# Patient Record
Sex: Male | Born: 1935 | Race: White | Hispanic: No | Marital: Married | Smoking: Former smoker
Health system: Southern US, Community
[De-identification: ages and names within clinical notes are randomized; demographics above are authoritative.]

## PROBLEM LIST (undated history)

## (undated) DIAGNOSIS — M1990 Unspecified osteoarthritis, unspecified site: Secondary | ICD-10-CM

## (undated) DIAGNOSIS — I10 Essential (primary) hypertension: Secondary | ICD-10-CM

## (undated) DIAGNOSIS — R21 Rash and other nonspecific skin eruption: Secondary | ICD-10-CM

## (undated) DIAGNOSIS — C61 Malignant neoplasm of prostate: Secondary | ICD-10-CM

## (undated) DIAGNOSIS — C801 Malignant (primary) neoplasm, unspecified: Secondary | ICD-10-CM

## (undated) DIAGNOSIS — K4090 Unilateral inguinal hernia, without obstruction or gangrene, not specified as recurrent: Secondary | ICD-10-CM

## (undated) DIAGNOSIS — K219 Gastro-esophageal reflux disease without esophagitis: Secondary | ICD-10-CM

## (undated) HISTORY — DX: Unspecified osteoarthritis, unspecified site: M19.90

## (undated) HISTORY — DX: Malignant (primary) neoplasm, unspecified: C80.1

## (undated) HISTORY — DX: Unilateral inguinal hernia, without obstruction or gangrene, not specified as recurrent: K40.90

## (undated) HISTORY — PX: MELANOMA EXCISION: SHX5266

## (undated) HISTORY — DX: Essential (primary) hypertension: I10

## (undated) HISTORY — DX: Malignant neoplasm of prostate: C61

## (undated) HISTORY — DX: Gastro-esophageal reflux disease without esophagitis: K21.9

## (undated) HISTORY — PX: OTHER SURGICAL HISTORY: SHX169

---

## 1997-11-20 ENCOUNTER — Other Ambulatory Visit: Admission: RE | Admit: 1997-11-20 | Discharge: 1997-11-20 | Payer: Self-pay | Admitting: Urology

## 2000-03-31 ENCOUNTER — Emergency Department (HOSPITAL_COMMUNITY): Admission: EM | Admit: 2000-03-31 | Discharge: 2000-03-31 | Payer: Self-pay | Admitting: Emergency Medicine

## 2002-04-11 ENCOUNTER — Ambulatory Visit (HOSPITAL_COMMUNITY): Admission: RE | Admit: 2002-04-11 | Discharge: 2002-04-11 | Payer: Self-pay | Admitting: General Surgery

## 2002-04-11 ENCOUNTER — Encounter: Payer: Self-pay | Admitting: General Surgery

## 2002-04-29 ENCOUNTER — Encounter: Payer: Self-pay | Admitting: General Surgery

## 2002-04-29 ENCOUNTER — Encounter: Admission: RE | Admit: 2002-04-29 | Discharge: 2002-04-29 | Payer: Self-pay | Admitting: General Surgery

## 2002-04-30 ENCOUNTER — Ambulatory Visit (HOSPITAL_BASED_OUTPATIENT_CLINIC_OR_DEPARTMENT_OTHER): Admission: RE | Admit: 2002-04-30 | Discharge: 2002-04-30 | Payer: Self-pay | Admitting: General Surgery

## 2002-05-03 ENCOUNTER — Ambulatory Visit (HOSPITAL_COMMUNITY): Admission: RE | Admit: 2002-05-03 | Discharge: 2002-05-03 | Payer: Self-pay | Admitting: Plastic Surgery

## 2002-08-26 ENCOUNTER — Ambulatory Visit: Admission: RE | Admit: 2002-08-26 | Discharge: 2002-10-22 | Payer: Self-pay | Admitting: Radiation Oncology

## 2005-06-07 ENCOUNTER — Encounter: Admission: RE | Admit: 2005-06-07 | Discharge: 2005-06-07 | Payer: Self-pay | Admitting: Internal Medicine

## 2005-06-09 ENCOUNTER — Encounter: Admission: RE | Admit: 2005-06-09 | Discharge: 2005-06-09 | Payer: Self-pay | Admitting: Internal Medicine

## 2005-06-13 HISTORY — PX: BLADDER SURGERY: SHX569

## 2005-06-16 ENCOUNTER — Other Ambulatory Visit: Admission: RE | Admit: 2005-06-16 | Discharge: 2005-06-16 | Payer: Self-pay | Admitting: Otolaryngology

## 2005-06-23 ENCOUNTER — Ambulatory Visit (HOSPITAL_COMMUNITY): Admission: RE | Admit: 2005-06-23 | Discharge: 2005-06-23 | Payer: Self-pay | Admitting: Otolaryngology

## 2005-06-24 ENCOUNTER — Encounter: Admission: RE | Admit: 2005-06-24 | Discharge: 2005-06-24 | Payer: Self-pay | Admitting: Otolaryngology

## 2005-07-08 ENCOUNTER — Encounter: Admission: RE | Admit: 2005-07-08 | Discharge: 2005-07-08 | Payer: Self-pay | Admitting: Internal Medicine

## 2005-10-07 ENCOUNTER — Encounter: Admission: RE | Admit: 2005-10-07 | Discharge: 2005-10-07 | Payer: Self-pay | Admitting: Urology

## 2005-10-10 ENCOUNTER — Ambulatory Visit (HOSPITAL_BASED_OUTPATIENT_CLINIC_OR_DEPARTMENT_OTHER): Admission: RE | Admit: 2005-10-10 | Discharge: 2005-10-10 | Payer: Self-pay | Admitting: Urology

## 2007-09-21 IMAGING — CT NM PET TUM IMG WHOLE BODY
4 series · 25 of 25 positions shown · IV contrast ([ID])
Comparison: None.

CLINICAL DATA: Recurrent left facial melanoma.  Recent surgical resection.  Staging.
 FDG PET-CT TUMOR IMAGING (WHOLE BODY):
 Fasting Blood Glucose:  122
TECHNIQUE: 17.6   mCi F-18 FDG was injected intravenously via the left arm.  Full-ring PET imaging was performed from the skull (base/vertex) through the (entire lower extremities/thighs) 49 minutes after injection.  CT data was obtained and used for attenuation correction and anatomic localization only.  (This was not acquired as a diagnostic CT examination).

[Series 1: pet ac · axial · 3.3mm · 4.69mm/px · z∈[-875,-5]mm · 8 of 267 slices shown]
[im 1/267]
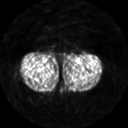
[im 39/267]
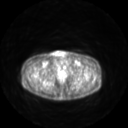
[im 77/267]
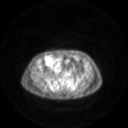
[im 115/267]
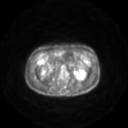
[im 153/267]
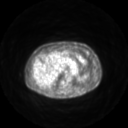
[im 191/267]
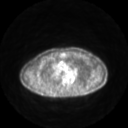
[im 229/267]
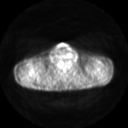
[im 267/267]
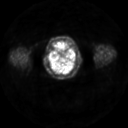

[Series 2: pet nac · axial · 3.3mm · 4.69mm/px · z∈[-875,-5]mm · 8 of 267 slices shown]
[im 1/267]
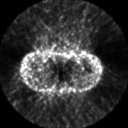
[im 39/267]
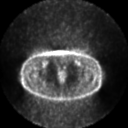
[im 77/267]
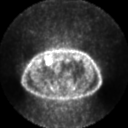
[im 115/267]
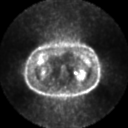
[im 153/267]
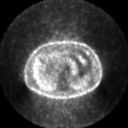
[im 191/267]
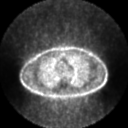
[im 229/267]
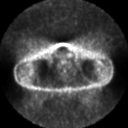
[im 267/267]
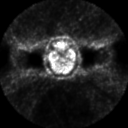

[Series 2: ct images · axial · 3.8mm · 0.98mm/px · z∈[-875,-5]mm · 8 of 267 slices shown]
[im 1/267  soft-tissue]
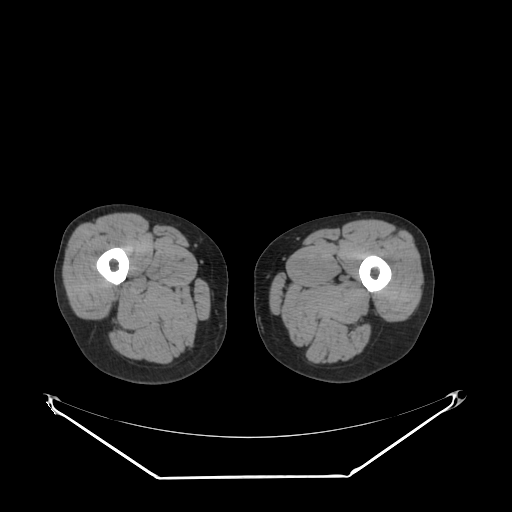
[im 39/267  soft-tissue]
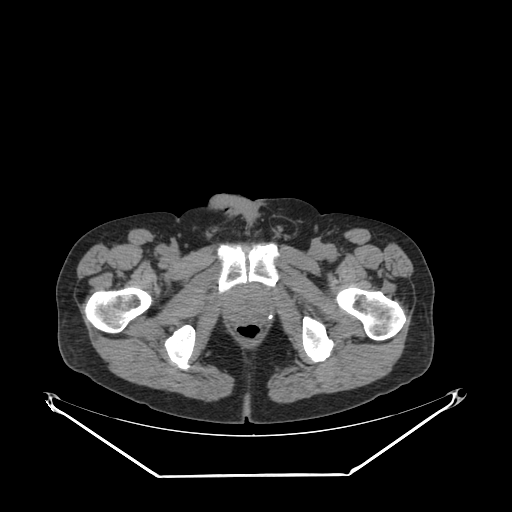
[im 77/267  soft-tissue]
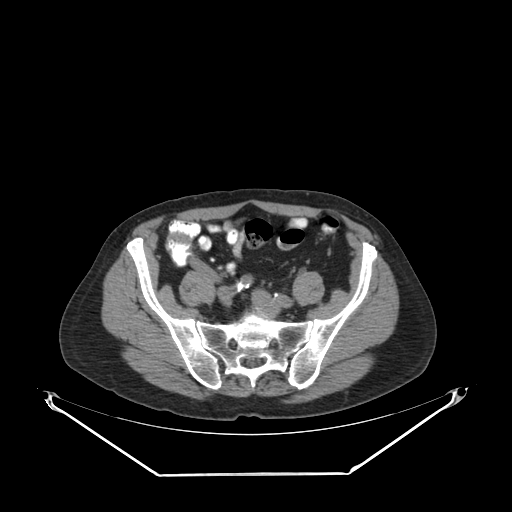
[im 115/267  soft-tissue]
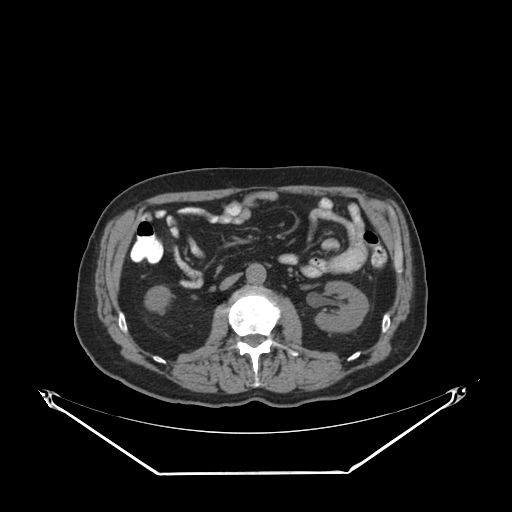
[im 153/267  soft-tissue]
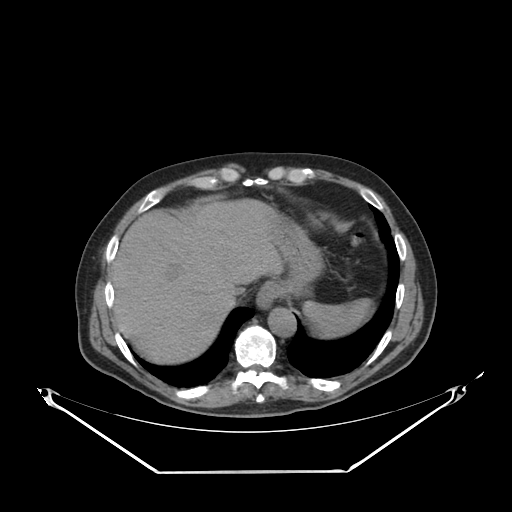
[im 191/267  soft-tissue]
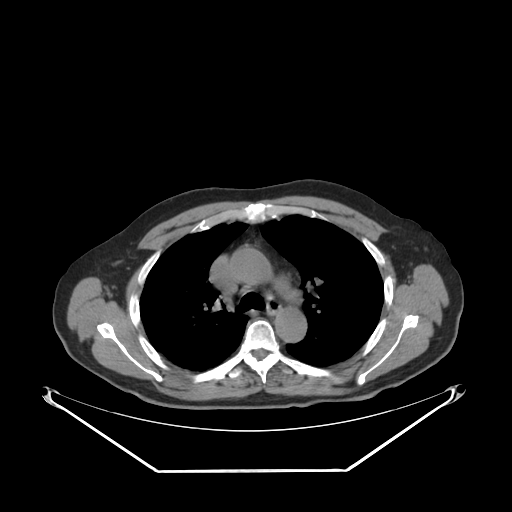
[im 229/267  soft-tissue]
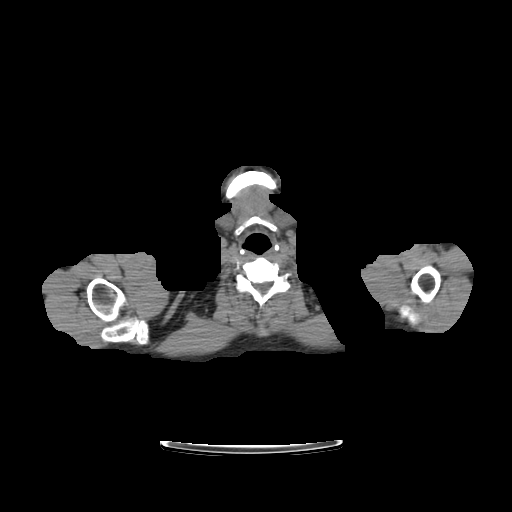
[im 267/267  soft-tissue]
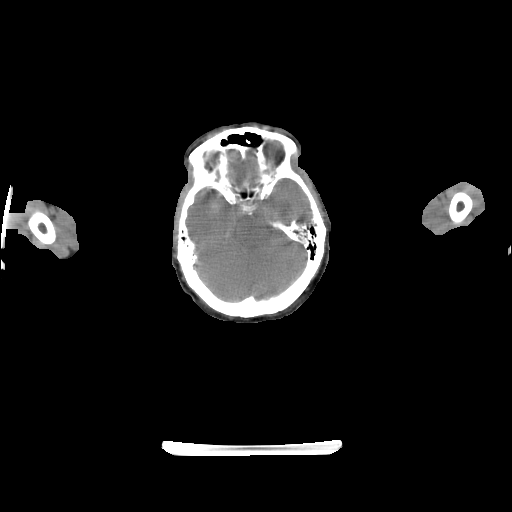

[Series 123: mip · coronal · 3.3mm · 4.69mm/px · 1 of 30 slices shown]
[im 1/30]
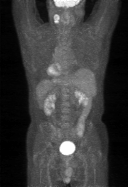

[25 of 25 positions shown; findings below may reference images not displayed]

FINDINGS: Marked FDG uptake is seen within lymphadenopathy which is centered in the left jugulodigastric region of the suprahyoid neck.  This has a maximum SUV of 12.3.  No other sites of abnormal FDG uptake are identified within the neck.
 No abnormal sites of FDG uptake are identified elsewhere within the body from the skull vertex through the lower extremities.
IMPRESSION: 1.  Marked FDG uptake within lymphadenopathy within the left suprahyoid neck, consistent with metastatic disease.
 2.  No other sites of malignancy identified.

## 2010-06-13 HISTORY — PX: RECTAL SURGERY: SHX760

## 2010-07-04 ENCOUNTER — Encounter: Payer: Self-pay | Admitting: Otolaryngology

## 2011-11-09 ENCOUNTER — Encounter (INDEPENDENT_AMBULATORY_CARE_PROVIDER_SITE_OTHER): Payer: 59 | Admitting: Ophthalmology

## 2011-11-09 DIAGNOSIS — H35039 Hypertensive retinopathy, unspecified eye: Secondary | ICD-10-CM

## 2011-11-09 DIAGNOSIS — H4310 Vitreous hemorrhage, unspecified eye: Secondary | ICD-10-CM

## 2011-11-09 DIAGNOSIS — H33309 Unspecified retinal break, unspecified eye: Secondary | ICD-10-CM

## 2011-11-09 DIAGNOSIS — H2510 Age-related nuclear cataract, unspecified eye: Secondary | ICD-10-CM

## 2011-11-09 DIAGNOSIS — I10 Essential (primary) hypertension: Secondary | ICD-10-CM

## 2011-11-09 DIAGNOSIS — H43819 Vitreous degeneration, unspecified eye: Secondary | ICD-10-CM

## 2011-11-16 ENCOUNTER — Ambulatory Visit (INDEPENDENT_AMBULATORY_CARE_PROVIDER_SITE_OTHER): Payer: 59 | Admitting: Ophthalmology

## 2011-11-17 ENCOUNTER — Ambulatory Visit (INDEPENDENT_AMBULATORY_CARE_PROVIDER_SITE_OTHER): Payer: 59 | Admitting: Ophthalmology

## 2011-11-17 DIAGNOSIS — H4310 Vitreous hemorrhage, unspecified eye: Secondary | ICD-10-CM

## 2011-11-17 DIAGNOSIS — H33309 Unspecified retinal break, unspecified eye: Secondary | ICD-10-CM

## 2011-12-28 ENCOUNTER — Encounter (INDEPENDENT_AMBULATORY_CARE_PROVIDER_SITE_OTHER): Payer: 59 | Admitting: Ophthalmology

## 2011-12-28 DIAGNOSIS — H33309 Unspecified retinal break, unspecified eye: Secondary | ICD-10-CM

## 2012-04-04 ENCOUNTER — Encounter (INDEPENDENT_AMBULATORY_CARE_PROVIDER_SITE_OTHER): Payer: 59 | Admitting: Ophthalmology

## 2012-04-04 DIAGNOSIS — H2510 Age-related nuclear cataract, unspecified eye: Secondary | ICD-10-CM

## 2012-04-04 DIAGNOSIS — H3530 Unspecified macular degeneration: Secondary | ICD-10-CM

## 2012-04-04 DIAGNOSIS — H35039 Hypertensive retinopathy, unspecified eye: Secondary | ICD-10-CM

## 2012-04-04 DIAGNOSIS — H33309 Unspecified retinal break, unspecified eye: Secondary | ICD-10-CM

## 2012-04-04 DIAGNOSIS — H43819 Vitreous degeneration, unspecified eye: Secondary | ICD-10-CM

## 2012-04-04 DIAGNOSIS — I10 Essential (primary) hypertension: Secondary | ICD-10-CM

## 2012-10-23 ENCOUNTER — Encounter (INDEPENDENT_AMBULATORY_CARE_PROVIDER_SITE_OTHER): Payer: Self-pay | Admitting: General Surgery

## 2012-10-23 ENCOUNTER — Ambulatory Visit (INDEPENDENT_AMBULATORY_CARE_PROVIDER_SITE_OTHER): Payer: 59 | Admitting: General Surgery

## 2012-10-23 VITALS — BP 120/78 | HR 74 | Temp 97.2°F | Ht 70.0 in | Wt 172.0 lb

## 2012-10-23 DIAGNOSIS — K429 Umbilical hernia without obstruction or gangrene: Secondary | ICD-10-CM

## 2012-10-23 DIAGNOSIS — K4090 Unilateral inguinal hernia, without obstruction or gangrene, not specified as recurrent: Secondary | ICD-10-CM

## 2012-10-23 NOTE — Patient Instructions (Signed)
You have a moderately large left inguinal hernia which has been causing pain. There is some risk to this and it should be repaired electively at her convenience.  You also have a small umbilical hernia, but there is no urgent need to repair that.  We have discussed different techniques, and he has elected to have the left anal hernia repaired laparoscopically.  He will be scheduled for laparoscopic repair of a left inguinal hernia with mesh, possible open. We might be able to repair the umbilical hernia all the way out since one of the incisions will be just below the umbilicus.   Inguinal Hernia, Adult Muscles help keep everything in the body in its proper place. But if a weak spot in the muscles develops, something can poke through. That is called a hernia. When this happens in the lower part of the belly (abdomen), it is called an inguinal hernia. (It takes its name from a part of the body in this region called the inguinal canal.) A weak spot in the wall of muscles lets some fat or part of the small intestine bulge through. An inguinal hernia can develop at any age. Men get them more often than women. CAUSES  In adults, an inguinal hernia develops over time.  It can be triggered by:  Suddenly straining the muscles of the lower abdomen.  Lifting heavy objects.  Straining to have a bowel movement. Difficult bowel movements (constipation) can lead to this.  Constant coughing. This may be caused by smoking or lung disease.  Being overweight.  Being pregnant.  Working at a job that requires long periods of standing or heavy lifting.  Having had an inguinal hernia before. One type can be an emergency situation. It is called a strangulated inguinal hernia. It develops if part of the small intestine slips through the weak spot and cannot get back into the abdomen. The blood supply can be cut off. If that happens, part of the intestine may die. This situation requires emergency  surgery. SYMPTOMS  Often, a small inguinal hernia has no symptoms. It is found when a healthcare provider does a physical exam. Larger hernias usually have symptoms.   In adults, symptoms may include:  A lump in the groin. This is easier to see when the person is standing. It might disappear when lying down.  In men, a lump in the scrotum.  Pain or burning in the groin. This occurs especially when lifting, straining or coughing.  A dull ache or feeling of pressure in the groin.  Signs of a strangulated hernia can include:  A bulge in the groin that becomes very painful and tender to the touch.  A bulge that turns red or purple.  Fever, nausea and vomiting.  Inability to have a bowel movement or to pass gas. DIAGNOSIS  To decide if you have an inguinal hernia, a healthcare provider will probably do a physical examination.  This will include asking questions about any symptoms you have noticed.  The healthcare provider might feel the groin area and ask you to cough. If an inguinal hernia is felt, the healthcare provider may try to slide it back into the abdomen.  Usually no other tests are needed. TREATMENT  Treatments can vary. The size of the hernia makes a difference. Options include:  Watchful waiting. This is often suggested if the hernia is small and you have had no symptoms.  No medical procedure will be done unless symptoms develop.  You will need to watch  closely for symptoms. If any occur, contact your healthcare provider right away.  Surgery. This is used if the hernia is larger or you have symptoms.  Open surgery. This is usually an outpatient procedure (you will not stay overnight in a hospital). An cut (incision) is made through the skin in the groin. The hernia is put back inside the abdomen. The weak area in the muscles is then repaired by herniorrhaphy or hernioplasty. Herniorrhaphy: in this type of surgery, the weak muscles are sewn back together.  Hernioplasty: a patch or mesh is used to close the weak area in the abdominal wall.  Laparoscopy. In this procedure, a surgeon makes small incisions. A thin tube with a tiny video camera (called a laparoscope) is put into the abdomen. The surgeon repairs the hernia with mesh by looking with the video camera and using two long instruments. HOME CARE INSTRUCTIONS   After surgery to repair an inguinal hernia:  You will need to take pain medicine prescribed by your healthcare provider. Follow all directions carefully.  You will need to take care of the wound from the incision.  Your activity will be restricted for awhile. This will probably include no heavy lifting for several weeks. You also should not do anything too active for a few weeks. When you can return to work will depend on the type of job that you have.  During "watchful waiting" periods, you should:  Maintain a healthy weight.  Eat a diet high in fiber (fruits, vegetables and whole grains).  Drink plenty of fluids to avoid constipation. This means drinking enough water and other liquids to keep your urine clear or pale yellow.  Do not lift heavy objects.  Do not stand for long periods of time.  Quit smoking. This should keep you from developing a frequent cough. SEEK MEDICAL CARE IF:   A bulge develops in your groin area.  You feel pain, a burning sensation or pressure in the groin. This might be worse if you are lifting or straining.  You develop a fever of more than 100.5 F (38.1 C). SEEK IMMEDIATE MEDICAL CARE IF:   Pain in the groin increases suddenly.  A bulge in the groin gets bigger suddenly and does not go down.  For men, there is sudden pain in the scrotum. Or, the size of the scrotum increases.  A bulge in the groin area becomes red or purple and is painful to touch.  You have nausea or vomiting that does not go away.  You feel your heart beating much faster than normal.  You cannot have a bowel  movement or pass gas.  You develop a fever of more than 102.0 F (38.9 C). Document Released: 10/16/2008 Document Revised: 08/22/2011 Document Reviewed: 10/16/2008 [facility] Patient Information 2013 [place], [place].   Umbilical Herniorrhaphy Herniorrhaphy is surgery to repair a hernia. A hernia is the protrusion of a part of an organ through an abdominal opening. An umbilical hernia means that your hernia is in the area around your navel. If the hernia is not repaired, the gap could get bigger. Your intestines or other tissues, such as fat, could get trapped in the gap. This can lead to other health problems, such as blocked intestines. If the hernia is fixed before problems set in, you may be allowed to go home the same day as the surgery (outpatient). LET YOUR CAREGIVER KNOW ABOUT:  Allergies to food or medicine.  Medicines taken, including vitamins, herbs, eyedrops, over-the-counter medicines, and creams.  Use of steroids (by mouth or creams).  Previous problems with anesthetics or numbing medicines.  History of bleeding problems or blood clots.  Previous surgery.  Other health problems, including diabetes and kidney problems.  Possibility of pregnancy, if this applies. RISKS AND COMPLICATIONS  Pain.  Excessive bleeding.  Hematoma. This is a pocket of blood that collects under the surgery site.  Infection at the surgery site.  Numbness at the surgery site.  Swelling and bruising.  Blood clots.  Intestinal damage (rare).  Scarring.  Skin damage.  Development of another hernia. This may require another surgery. BEFORE THE PROCEDURE  Ask your caregiver about changing or stopping your regular medicines. You may need to stop taking aspirin, nonsteroidal anti-inflammatory drugs (NSAIDs), vitamin E, and blood thinners as early as 2 weeks before the procedure.  Do not eat or drink for 8 hours before the procedure, or as directed by your caregiver.  You might be asked  to shower or wash with an antibacterial soap before the procedure.  Wear comfortable clothes that will be easy to put on after the procedure. PROCEDURE You will be given an intravenous (IV) tube. A needle will be inserted in your arm. Medicine will flow directly into your body through this needle. You might be given medicine to help you relax (sedative). You will be given medicine that numbs the area (local anesthetic) or medicine that makes you sleep (general anesthetic). If you have open surgery:  The surgeon will make a cut (incision) in your abdomen.  The gap in the muscle wall will be repaired. The surgeon may sew the edges together over the gap or use a mesh material to strengthen the area. When mesh is used, the body grows new, strong tissue into and around it. This new tissue closes the gap.  A drain might be put in to remove excess fluid from the body after surgery.  The surgeon will close the incision with stitches, glue, or staples. If you have laparoscopic surgery:  The surgeon will make several small incisions in your abdomen.  A thin, lighted tube (laparoscope) will be inserted into the abdomen through an incision. A camera is attached to the laparoscope that allows the surgeon to see inside the abdomen.  Tools will be inserted through the other incisions to repair the hernia. Usually, mesh is used to cover the gap.  The surgeon will close the incisions with stitches. AFTER THE PROCEDURE  You will be taken to a recovery area. A nurse will watch and check your progress.  When you are awake, feeling well, and taking fluids well, you may be allowed to go home. In some cases, you may need to stay overnight in the hospital.  Arrange for someone to drive you home. Document Released: 08/26/2008 Document Revised: 11/29/2011 Document Reviewed: 08/31/2011 [facility] Patient Information 2013 [place], [place].

## 2012-10-23 NOTE — Progress Notes (Signed)
Patient ID: Thomas Norris, male   DOB: 03-25-1928, 79 y.o.   MRN: [id number]  Chief Complaint  Patient presents with  . Inguinal Hernia    HPI Thomas Norris is a 79 y.o. male.  He is referred by Dr. Johnella Moloney for evaluation of a symptomatic left inguinal hernia.  This patient gives a one-year history of a left groin bulge. This has become larger and became quite painful recently when he was playing golf. He feels a lump. He can usually push this back and it might. He also has a tiny bulge at his umbilicus but no symptoms.  Significant past history includes stage IV melanoma managed at [facility] and [facility]. A left radical neck resection by [facility].    he has responded to immunotherapy for a couple of nodules in his belly and he is healthy and doing very well with no known disease at present. He also has been treated for prostate cancer. Hypertension. GERD. Has allergies and uses inhalers occasionally.  Socially he remains active. 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 He is active playing golf.  HPI  Past Medical History  Diagnosis Date  . Arthritis   . Inguinal hernia   . Cancer     Melanoma stage 4  . Prostate ca   . GERD (gastroesophageal reflux disease)   . Hypertension     Past Surgical History  Procedure Laterality Date  . Melanoma excision  2003, 2007, 2008  . Bladder surgery  2007  . Rectal surgery  2012    rectal abscess  . Prostate surgery      No family history on file.  Social History History  Substance Use Topics  . Smoking status: Former Smoker    Quit date: 08/28/1975  . Smokeless tobacco: Not on file  . Alcohol Use: Yes    Allergies  Allergen Reactions  . Latex Rash    Current Outpatient Prescriptions  Medication Sig Dispense Refill  . cholecalciferol (VITAMIN D) 1000 UNITS tablet Take 2,000 Units by mouth daily.      Marland Kitchen dexlansoprazole (DEXILANT) 60 MG capsule Take 60 mg by mouth as needed.      . Multiple Vitamin  (MULTIVITAMIN) tablet Take 1 tablet by mouth daily.      . tamsulosin (FLOMAX) 0.4 MG CAPS Take 0.4 mg by mouth daily.      . valsartan-hydrochlorothiazide (DIOVAN-HCT) 160-25 MG per tablet Take 1 tablet by mouth daily.       No current facility-administered medications for this visit.    Review of Systems Review of Systems  Constitutional: Negative for fever, chills and unexpected weight change.  HENT: Negative for hearing loss, congestion, sore throat, trouble swallowing and voice change.   Eyes: Negative for visual disturbance.  Respiratory: Negative for cough and wheezing.   Cardiovascular: Negative for chest pain, palpitations and leg swelling.  Gastrointestinal: Negative for nausea, vomiting, abdominal pain, diarrhea, constipation, blood in stool, abdominal distention, anal bleeding and rectal pain.  Genitourinary: Negative for hematuria and difficulty urinating.  Musculoskeletal: Negative for arthralgias.  Skin: Negative for rash and wound.  Neurological: Negative for seizures, syncope, weakness and headaches.  Hematological: Negative for adenopathy. Does not bruise/bleed easily.  Psychiatric/Behavioral: Negative for confusion.    Blood pressure 120/78, pulse 74, temperature 97.2 F (36.2 C), temperature source Oral, height 5\' 10"  (1.778 m), weight 172 lb (78.019 kg).  Physical Exam Physical Exam  Constitutional: He is oriented to person, place, and time. He  appears well-developed and well-nourished. No distress.  HENT:  Head: Normocephalic.  Nose: Nose normal.  Mouth/Throat: No oropharyngeal exudate.  Eyes: Conjunctivae and EOM are normal. Pupils are equal, round, and reactive to light. Right eye exhibits no discharge. Left eye exhibits no discharge. No scleral icterus.  Neck: Normal range of motion. Neck supple. No JVD present. No tracheal deviation present. No thyromegaly present.  Voice normal. Left radical neck dissection defect. No nodules. No skin lesions.   Cardiovascular: Normal rate, regular rhythm, normal heart sounds and intact distal pulses.   No murmur heard. Pulmonary/Chest: Effort normal and breath sounds normal. No stridor. No respiratory distress. He has no wheezes. He has no rales. He exhibits no tenderness.  Abdominal: Soft. Bowel sounds are normal. He exhibits no distension and no mass. There is no tenderness. There is no rebound and no guarding.  Abdomen soft. Tiny umbilical hernia. No scars noted.  Genitourinary:  There is a small to medium sized left inguinal hernia. I can reduce this when supine. I do not feel a hernia on the right. No scrotal mass.  Musculoskeletal: Normal range of motion. He exhibits no edema and no tenderness.  Lymphadenopathy:    He has no cervical adenopathy.  Neurological: He is alert and oriented to person, place, and time. He has normal reflexes. Coordination normal.  Skin: Skin is warm and dry. No rash noted. He is not diaphoretic. No erythema. No pallor.  Psychiatric: He has a normal mood and affect. His behavior is normal. Judgment and thought content normal.    Data Reviewed Dr. Kevan Ny office notes.  Assessment    Symptomatic left inguinal hernia, reducible. I think he would benefit from elective repair  History stage IV melanoma with left radical neck dissection. In remission  History prostate cancer  Hypertension  Seasonal allergies  History GERD on medication     Plan    He would like to go ahead and have the hernia repaired. We talked a long time about open repair versus laparoscopic repair. The pros and cons both symptomatically and financially. He thinks he would like to have this done laparoscopically which is perfectly appropriate. I told him we might be able to repair the umbilical hernia on the way out.  I discussed the indications, details, techniques, and numerous risks of laparoscopic left inguinal hernia repair. He is aware that this might have to be converted to open and  he is comfortable with that. He understands all these issues. All his questions were answered. He agrees with this plan.  He has requested that I do this at [facility].       Angelia Mould. Derrell Lolling, M.D., [facility] General and Minimally invasive Surgery Breast and Colorectal Surgery Office:   [phone] Pager:   [phone]  10/23/2012, 5:01 PM

## 2012-10-25 ENCOUNTER — Encounter (HOSPITAL_COMMUNITY): Payer: Self-pay | Admitting: Pharmacy Technician

## 2012-10-26 ENCOUNTER — Other Ambulatory Visit (HOSPITAL_COMMUNITY): Payer: Self-pay | Admitting: General Surgery

## 2012-10-26 NOTE — Patient Instructions (Addendum)
20 Thomas Norris  10/26/2012   Your procedure is scheduled on: 11-07-2012  Report to [facility] at 1000 AM.  Call this number if you have problems the morning of surgery [phone]   Remember:   Do not eat food or drink liquids :After Midnight.     Take these medicines the morning of surgery with A SIP OF WATER: dexilant                                SEE [facility] PREPARING FOR SURGERY SHEET   Do not wear jewelry, make-up or nail polish.  Do not wear lotions, powders, or perfumes. You may wear deodorant.   Men may shave face and neck.  Do not bring valuables to the hospital.  Contacts, dentures or bridgework may not be worn into surgery.  Leave suitcase in the car. After surgery it may be brought to your room.  For patients admitted to the hospital, checkout time is 11:00 AM the day of discharge.   Patients discharged the day of surgery will not be allowed to drive home.  Name and phone number of your driver:wife Thomas Norris cell [phone]  Special Instructions: N/A   Please read over the following fact sheets that you were given: MRSA Information.  Call Cain Sieve RN pre op nurse if needed [phone]    FAILURE TO FOLLOW THESE INSTRUCTIONS MAY RESULT IN THE CANCELLATION OF YOUR SURGERY. PATIENT SIGNATURE___________________________________________

## 2012-10-29 ENCOUNTER — Ambulatory Visit (HOSPITAL_COMMUNITY)
Admission: RE | Admit: 2012-10-29 | Discharge: 2012-10-29 | Disposition: A | Discharge status: Home / Self Care | Payer: 59 | Source: Ambulatory Visit | Attending: General Surgery | Admitting: General Surgery

## 2012-10-29 ENCOUNTER — Encounter (HOSPITAL_COMMUNITY): Payer: Self-pay

## 2012-10-29 ENCOUNTER — Encounter (HOSPITAL_COMMUNITY)
Admission: RE | Admit: 2012-10-29 | Discharge: 2012-10-29 | Disposition: A | Discharge status: Home / Self Care | Payer: 59 | Source: Ambulatory Visit | Attending: General Surgery | Admitting: General Surgery

## 2012-10-29 DIAGNOSIS — Z01810 Encounter for preprocedural cardiovascular examination: Secondary | ICD-10-CM | POA: Insufficient documentation

## 2012-10-29 DIAGNOSIS — Z01812 Encounter for preprocedural laboratory examination: Secondary | ICD-10-CM | POA: Insufficient documentation

## 2012-10-29 DIAGNOSIS — Z01818 Encounter for other preprocedural examination: Secondary | ICD-10-CM | POA: Insufficient documentation

## 2012-10-29 DIAGNOSIS — I10 Essential (primary) hypertension: Secondary | ICD-10-CM | POA: Insufficient documentation

## 2012-10-29 DIAGNOSIS — Z85820 Personal history of malignant melanoma of skin: Secondary | ICD-10-CM | POA: Insufficient documentation

## 2012-10-29 LAB — COMPREHENSIVE METABOLIC PANEL
Alkaline Phosphatase: 78 U/L (ref 39–117)
BUN: 14 mg/dL (ref 6–23)
Chloride: 99 mEq/L (ref 96–112)
GFR calc Af Amer: 89 mL/min — ABNORMAL LOW (ref >90)
Glucose, Bld: 100 mg/dL — ABNORMAL HIGH (ref 70–99)
Potassium: 4.1 mEq/L (ref 3.5–5.1)
Total Bilirubin: 0.4 mg/dL (ref 0.3–1.2)

## 2012-10-29 LAB — URINALYSIS, ROUTINE W REFLEX MICROSCOPIC
Bilirubin Urine: NEGATIVE
Glucose, UA: NEGATIVE mg/dL
Ketones, ur: NEGATIVE mg/dL
Leukocytes, UA: NEGATIVE
Nitrite: NEGATIVE
Protein, ur: NEGATIVE mg/dL

## 2012-10-29 LAB — CBC WITH DIFFERENTIAL/PLATELET
Hemoglobin: 13.5 g/dL (ref 13.0–17.0)
Lymphocytes Relative: 18 % (ref 12–46)
Lymphs Abs: 1 10*3/uL (ref 0.7–4.0)
Monocytes Relative: 15 % — ABNORMAL HIGH (ref 3–12)
Neutro Abs: 3.3 10*3/uL (ref 1.7–7.7)
Neutrophils Relative %: 59 % (ref 43–77)
RBC: 4.46 MIL/uL (ref 4.22–5.81)

## 2012-10-29 NOTE — Progress Notes (Signed)
10/29/12 1101  OBSTRUCTIVE SLEEP APNEA  Have you ever been diagnosed with sleep apnea through a sleep study? No  Do you snore loudly (loud enough to be heard through closed doors)?  1  Do you often feel tired, fatigued, or sleepy during the daytime? 0  Has anyone observed you stop breathing during your sleep? 0  Do you have, or are you being treated for high blood pressure? 1  BMI more than 35 kg/m2? 0  Age over 77 years old? 1  Neck circumference greater than 40 cm/18 inches? 0  Gender: 1  Obstructive Sleep Apnea Score 4  Score 4 or greater  Results sent to PCP

## 2012-10-31 ENCOUNTER — Encounter (HOSPITAL_COMMUNITY): Payer: Self-pay

## 2012-10-31 NOTE — Progress Notes (Addendum)
Showed dr germouth anesthesia final ekg from 10-29-2012, ekg ok to use for 11-07-2012 surgery, dr Thea Gist signed printed copy of ekg, rn placed this copy of ekg on pt chart. Pt's old ekg from 01-29-2002 also placed on pts chart and showed to dr germouth. Echo 09-21-2011 [facility] on chart

## 2012-11-02 ENCOUNTER — Telehealth (INDEPENDENT_AMBULATORY_CARE_PROVIDER_SITE_OTHER): Payer: Self-pay

## 2012-11-02 NOTE — Telephone Encounter (Signed)
I called and gave the patient's wife his postop appointment for 11/26/2012

## 2012-11-05 NOTE — H&P (Signed)
Thomas Norris   MRN:  [id number]   Description: 79 year old male  Provider: Ernestene Mention, MD  Department: [facility]        Diagnoses    Left inguinal hernia    -  Primary    550.90    Umbilical hernia        553.1        Current Vitals - Last Recorded    BP Pulse Temp(Src) Ht Wt BMI    120/78 74 97.2 F (36.2 C) (Oral) 5\' 10"  (1.778 m) 172 lb (78.019 kg) 24.68 kg/m2       History and Physical    Ernestene Mention, MD    Status: Signed                         HPI Thomas Norris is a 79 y.o. male.  He is referred by Dr. Johnella Moloney for evaluation of a symptomatic left inguinal hernia.   This patient gives a one-year history of a left groin bulge. This has become larger and became quite painful recently when he was playing golf. He feels a lump. He can usually push this back and it might. He also has a tiny bulge at his umbilicus but no symptoms.   Significant past history includes stage IV melanoma managed at [facility] and [facility]. A left radical neck resection by Joaquin Bend.    he has responded to immunotherapy for a couple of nodules in his belly and he is healthy and doing very well with no known disease at present. He also has been treated for prostate cancer. Hypertension. GERD. Has allergies and uses inhalers occasionally.   Socially he remains active. 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 He is active playing golf.         Past Medical History   Diagnosis  Date   .  Arthritis     .  Inguinal hernia     .  Cancer         Melanoma stage 4   .  Prostate ca     .  GERD (gastroesophageal reflux disease)     .  Hypertension           Past Surgical History   Procedure  Laterality  Date   .  Melanoma excision    2003, 2007, 2008   .  Bladder surgery    2007   .  Rectal surgery    2012       rectal abscess   .  Prostate surgery            No family history on file.   Social History History    Substance Use Topics   .  Smoking status:  Former Smoker       Quit date:  08/28/1975   .  Smokeless tobacco:  Not on file   .  Alcohol Use:  Yes         Allergies   Allergen  Reactions   .  Latex  Rash         Current Outpatient Prescriptions   Medication  Sig  Dispense  Refill   .  cholecalciferol (VITAMIN D) 1000 UNITS tablet  Take 2,000 Units by mouth daily.         Marland Kitchen  dexlansoprazole (DEXILANT) 60 MG capsule  Take 60 mg by mouth as needed.         Marland Kitchen  Multiple Vitamin (MULTIVITAMIN) tablet  Take 1 tablet by mouth daily.         .  tamsulosin (FLOMAX) 0.4 MG CAPS  Take 0.4 mg by mouth daily.         .  valsartan-hydrochlorothiazide (DIOVAN-HCT) 160-25 MG per tablet  Take 1 tablet by mouth daily.             No current facility-administered medications for this visit.        Review of Systems   Constitutional: Negative for fever, chills and unexpected weight change.  HENT: Negative for hearing loss, congestion, sore throat, trouble swallowing and voice change.   Eyes: Negative for visual disturbance.  Respiratory: Negative for cough and wheezing.   Cardiovascular: Negative for chest pain, palpitations and leg swelling.  Gastrointestinal: Negative for nausea, vomiting, abdominal pain, diarrhea, constipation, blood in stool, abdominal distention, anal bleeding and rectal pain.  Genitourinary: Negative for hematuria and difficulty urinating.  Musculoskeletal: Negative for arthralgias.  Skin: Negative for rash and wound.  Neurological: Negative for seizures, syncope, weakness and headaches.  Hematological: Negative for adenopathy. Does not bruise/bleed easily.  Psychiatric/Behavioral: Negative for confusion.      Blood pressure 120/78, pulse 74, temperature 97.2 F (36.2 C), temperature source Oral, height 5\' 10"  (1.778 m), weight 172 lb (78.019 kg).   Physical Exam   Constitutional: He is oriented to person, place, and time. He appears well-developed and  well-nourished. No distress.  HENT:   Head: Normocephalic.   Nose: Nose normal.   Mouth/Throat: No oropharyngeal exudate.  Eyes: Conjunctivae and EOM are normal. Pupils are equal, round, and reactive to light. Right eye exhibits no discharge. Left eye exhibits no discharge. No scleral icterus.  Neck: Normal range of motion. Neck supple. No JVD present. No tracheal deviation present. No thyromegaly present.  Voice normal. Left radical neck dissection defect. No nodules. No skin lesions.  Cardiovascular: Normal rate, regular rhythm, normal heart sounds and intact distal pulses.    No murmur heard. Pulmonary/Chest: Effort normal and breath sounds normal. No stridor. No respiratory distress. He has no wheezes. He has no rales. He exhibits no tenderness.  Abdominal: Soft. Bowel sounds are normal. He exhibits no distension and no mass. There is no tenderness. There is no rebound and no guarding.  Abdomen soft. Tiny umbilical hernia. No scars noted.  Genitourinary:  There is a small to medium sized left inguinal hernia. I can reduce this when supine. I do not feel a hernia on the right. No scrotal mass.  Musculoskeletal: Normal range of motion. He exhibits no edema and no tenderness.  Lymphadenopathy:    He has no cervical adenopathy.  Neurological: He is alert and oriented to person, place, and time. He has normal reflexes. Coordination normal.  Skin: Skin is warm and dry. No rash noted. He is not diaphoretic. No erythema. No pallor.  Psychiatric: He has a normal mood and affect. His behavior is normal. Judgment and thought content normal.      Data Reviewed Dr. Kevan Ny office notes.   Assessment    Symptomatic left inguinal hernia, reducible. I think he would benefit from elective repair   History stage IV melanoma with left radical neck dissection. In remission   History prostate cancer   Hypertension   Seasonal allergies   History GERD on medication      Plan    He  would like to go ahead and have the hernia repaired. We talked a long time about open repair  versus laparoscopic repair. The pros and cons both symptomatically and financially. He thinks he would like to have this done laparoscopically which is perfectly appropriate. I told him we might be able to repair the umbilical hernia on the way out.   I discussed the indications, details, techniques, and numerous risks of laparoscopic left inguinal hernia repair. He is aware that this might have to be converted to open and he is comfortable with that. He understands all these issues. All his questions were answered. He agrees with this plan.   He has requested that I do this at [facility].          Angelia Mould. Derrell Lolling, M.D., [facility] General and Minimally invasive Surgery Breast and Colorectal Surgery Office:   [phone] Pager:   [phone]

## 2012-11-07 ENCOUNTER — Encounter (HOSPITAL_COMMUNITY): Payer: Self-pay | Admitting: Anesthesiology

## 2012-11-07 ENCOUNTER — Encounter (HOSPITAL_COMMUNITY)
Admission: RE | Disposition: A | Discharge status: Home / Self Care | Payer: Self-pay | Source: Ambulatory Visit | Attending: General Surgery

## 2012-11-07 ENCOUNTER — Encounter (HOSPITAL_COMMUNITY): Payer: Self-pay | Admitting: *Deleted

## 2012-11-07 ENCOUNTER — Ambulatory Visit (HOSPITAL_COMMUNITY): Payer: 59 | Admitting: Anesthesiology

## 2012-11-07 ENCOUNTER — Ambulatory Visit (HOSPITAL_COMMUNITY)
Admission: RE | Admit: 2012-11-07 | Discharge: 2012-11-07 | Disposition: A | Discharge status: Home / Self Care | Payer: 59 | Source: Ambulatory Visit | Attending: General Surgery | Admitting: General Surgery

## 2012-11-07 DIAGNOSIS — I10 Essential (primary) hypertension: Secondary | ICD-10-CM | POA: Insufficient documentation

## 2012-11-07 DIAGNOSIS — K4090 Unilateral inguinal hernia, without obstruction or gangrene, not specified as recurrent: Secondary | ICD-10-CM

## 2012-11-07 DIAGNOSIS — Z85820 Personal history of malignant melanoma of skin: Secondary | ICD-10-CM | POA: Insufficient documentation

## 2012-11-07 DIAGNOSIS — Z87891 Personal history of nicotine dependence: Secondary | ICD-10-CM | POA: Insufficient documentation

## 2012-11-07 DIAGNOSIS — D1779 Benign lipomatous neoplasm of other sites: Secondary | ICD-10-CM | POA: Insufficient documentation

## 2012-11-07 DIAGNOSIS — Z91040 Latex allergy status: Secondary | ICD-10-CM | POA: Insufficient documentation

## 2012-11-07 DIAGNOSIS — K219 Gastro-esophageal reflux disease without esophagitis: Secondary | ICD-10-CM | POA: Insufficient documentation

## 2012-11-07 DIAGNOSIS — Z8546 Personal history of malignant neoplasm of prostate: Secondary | ICD-10-CM | POA: Insufficient documentation

## 2012-11-07 DIAGNOSIS — Z79899 Other long term (current) drug therapy: Secondary | ICD-10-CM | POA: Insufficient documentation

## 2012-11-07 HISTORY — PX: INSERTION OF MESH: SHX5868

## 2012-11-07 HISTORY — PX: INGUINAL HERNIA REPAIR: SHX194

## 2012-11-07 SURGERY — REPAIR, HERNIA, INGUINAL, LAPAROSCOPIC
Anesthesia: General | Site: Groin | Laterality: Left | Wound class: Clean

## 2012-11-07 MED ORDER — SODIUM CHLORIDE 0.9 % IV SOLN: INTRAVENOUS | Status: DC

## 2012-11-07 MED ORDER — ONDANSETRON HCL 4 MG/2ML IJ SOLN
INTRAMUSCULAR | Status: DC | PRN
  Administered 2012-11-07: 4 mg via INTRAVENOUS

## 2012-11-07 MED ORDER — ACETAMINOPHEN 10 MG/ML IV SOLN: 1000.0000 mg | Freq: Once | INTRAVENOUS | Status: DC | PRN

## 2012-11-07 MED ORDER — BUPIVACAINE-EPINEPHRINE 0.5% -1:200000 IJ SOLN
INTRAMUSCULAR | Status: DC | PRN
  Administered 2012-11-07: 10 mL

## 2012-11-07 MED ORDER — HYDROMORPHONE HCL PF 1 MG/ML IJ SOLN
0.2500 mg | INTRAMUSCULAR | Status: DC | PRN
Start: 2012-11-07 — End: 2012-11-07

## 2012-11-07 MED ORDER — ACETAMINOPHEN 10 MG/ML IV SOLN
INTRAVENOUS | Status: AC
  Filled 2012-11-07: qty 100

## 2012-11-07 MED ORDER — BUPIVACAINE-EPINEPHRINE 0.5% -1:200000 IJ SOLN
INTRAMUSCULAR | Status: AC
Start: 2012-11-07 — End: 2012-11-07
  Filled 2012-11-07: qty 1

## 2012-11-07 MED ORDER — CEFOXITIN SODIUM-DEXTROSE 1-4 GM-% IV SOLR (PREMIX)
INTRAVENOUS | Status: AC
  Filled 2012-11-07: qty 50

## 2012-11-07 MED ORDER — LIDOCAINE HCL (CARDIAC) 20 MG/ML IV SOLN
INTRAVENOUS | Status: DC | PRN
  Administered 2012-11-07: 50 mg via INTRAVENOUS

## 2012-11-07 MED ORDER — ROCURONIUM BROMIDE 100 MG/10ML IV SOLN
INTRAVENOUS | Status: DC | PRN
  Administered 2012-11-07: 40 mg via INTRAVENOUS
  Administered 2012-11-07: 10 mg via INTRAVENOUS

## 2012-11-07 MED ORDER — EPHEDRINE SULFATE 50 MG/ML IJ SOLN
INTRAMUSCULAR | Status: DC | PRN
  Administered 2012-11-07: 10 mg via INTRAVENOUS

## 2012-11-07 MED ORDER — SODIUM CHLORIDE 0.9 % IV SOLN: 250.0000 mL | INTRAVENOUS | Status: DC | PRN

## 2012-11-07 MED ORDER — NEOSTIGMINE METHYLSULFATE 1 MG/ML IJ SOLN
INTRAMUSCULAR | Status: DC | PRN
  Administered 2012-11-07: 4 mg via INTRAVENOUS

## 2012-11-07 MED ORDER — HYDROCODONE-ACETAMINOPHEN 5-325 MG PO TABS: 1.0000 | ORAL_TABLET | ORAL | Status: DC | PRN

## 2012-11-07 MED ORDER — SODIUM CHLORIDE 0.9 % IJ SOLN: 3.0000 mL | Freq: Two times a day (BID) | INTRAMUSCULAR | Status: DC

## 2012-11-07 MED ORDER — PROMETHAZINE HCL 25 MG/ML IJ SOLN: 6.2500 mg | INTRAMUSCULAR | Status: DC | PRN

## 2012-11-07 MED ORDER — KETOROLAC TROMETHAMINE 30 MG/ML IJ SOLN
INTRAMUSCULAR | Status: DC | PRN
  Administered 2012-11-07: 15 mg via INTRAVENOUS

## 2012-11-07 MED ORDER — GLYCOPYRROLATE 0.2 MG/ML IJ SOLN
INTRAMUSCULAR | Status: DC | PRN
  Administered 2012-11-07: 0.6 mg via INTRAVENOUS

## 2012-11-07 MED ORDER — FENTANYL CITRATE 0.05 MG/ML IJ SOLN
INTRAMUSCULAR | Status: DC | PRN
  Administered 2012-11-07 (×5): 50 ug via INTRAVENOUS

## 2012-11-07 MED ORDER — MEPERIDINE HCL 50 MG/ML IJ SOLN: 6.2500 mg | INTRAMUSCULAR | Status: DC | PRN

## 2012-11-07 MED ORDER — CEFAZOLIN SODIUM-DEXTROSE 2-3 GM-% IV SOLR
2.0000 g | INTRAVENOUS | Status: AC
  Administered 2012-11-07: 2 g via INTRAVENOUS

## 2012-11-07 MED ORDER — MORPHINE SULFATE 10 MG/ML IJ SOLN: 2.0000 mg | INTRAMUSCULAR | Status: DC | PRN

## 2012-11-07 MED ORDER — SODIUM CHLORIDE 0.9 % IJ SOLN: 3.0000 mL | INTRAMUSCULAR | Status: DC | PRN

## 2012-11-07 MED ORDER — OXYCODONE HCL 5 MG PO TABS
5.0000 mg | ORAL_TABLET | ORAL | Status: DC | PRN
  Administered 2012-11-07: 5 mg via ORAL
  Filled 2012-11-07: qty 1

## 2012-11-07 MED ORDER — ACETAMINOPHEN 10 MG/ML IV SOLN
INTRAVENOUS | Status: DC | PRN
  Administered 2012-11-07: 1000 mg via INTRAVENOUS

## 2012-11-07 MED ORDER — OXYCODONE HCL 5 MG/5ML PO SOLN
5.0000 mg | Freq: Once | ORAL | Status: AC | PRN
  Filled 2012-11-07: qty 5

## 2012-11-07 MED ORDER — ACETAMINOPHEN 650 MG RE SUPP
650.0000 mg | RECTAL | Status: DC | PRN
  Filled 2012-11-07: qty 1

## 2012-11-07 MED ORDER — PROPOFOL 10 MG/ML IV BOLUS
INTRAVENOUS | Status: DC | PRN
  Administered 2012-11-07: 180 mg via INTRAVENOUS

## 2012-11-07 MED ORDER — ONDANSETRON HCL 4 MG/2ML IJ SOLN: 4.0000 mg | Freq: Four times a day (QID) | INTRAMUSCULAR | Status: DC | PRN

## 2012-11-07 MED ORDER — ACETAMINOPHEN 325 MG PO TABS: 650.0000 mg | ORAL_TABLET | ORAL | Status: DC | PRN

## 2012-11-07 MED ORDER — LACTATED RINGERS IV SOLN
INTRAVENOUS | Status: DC
  Administered 2012-11-07: 13:00:00 via INTRAVENOUS
  Administered 2012-11-07: 1000 mL via INTRAVENOUS

## 2012-11-07 MED ORDER — OXYCODONE HCL 5 MG PO TABS
5.0000 mg | ORAL_TABLET | Freq: Once | ORAL | Status: AC | PRN
  Administered 2012-11-07: 5 mg via ORAL
  Filled 2012-11-07: qty 1

## 2012-11-07 MED ORDER — LACTATED RINGERS IV SOLN
INTRAVENOUS | Status: DC | PRN
  Administered 2012-11-07: 1000 mL via INTRAVENOUS

## 2012-11-07 SURGICAL SUPPLY — 36 items
APPLIER CLIP LOGIC TI 5 (MISCELLANEOUS) IMPLANT
BENZOIN TINCTURE PRP APPL 2/3 (GAUZE/BANDAGES/DRESSINGS) IMPLANT
CABLE HIGH FREQUENCY MONO STRZ (ELECTRODE) IMPLANT
CATH FOLEY LATEX FREE 16FR (CATHETERS) ×2 IMPLANT
CLOTH BEACON ORANGE TIMEOUT ST (SAFETY) ×2 IMPLANT
DECANTER SPIKE VIAL GLASS SM (MISCELLANEOUS) IMPLANT
DERMABOND ADVANCED (GAUZE/BANDAGES/DRESSINGS) ×1
DERMABOND ADVANCED .7 DNX12 (GAUZE/BANDAGES/DRESSINGS) ×1 IMPLANT
DISSECT BALLN SPACEMKR + OVL (BALLOONS) ×2
DISSECTOR BALLN SPACEMKR + OVL (BALLOONS) ×1 IMPLANT
DISSECTOR BLUNT TIP ENDO 5MM (MISCELLANEOUS) IMPLANT
DRAPE LAPAROSCOPIC ABDOMINAL (DRAPES) ×2 IMPLANT
ELECT REM PT RETURN 9FT ADLT (ELECTROSURGICAL) ×2
ELECTRODE REM PT RTRN 9FT ADLT (ELECTROSURGICAL) ×1 IMPLANT
GLOVE EUDERMIC 7 POWDERFREE (GLOVE) IMPLANT
GOWN STRL NON-REIN LRG LVL3 (GOWN DISPOSABLE) IMPLANT
GOWN STRL REIN XL XLG (GOWN DISPOSABLE) ×6 IMPLANT
KIT BASIN OR (CUSTOM PROCEDURE TRAY) ×2 IMPLANT
MESH ULTRAPRO 3X6 7.6X15CM (Mesh General) ×4 IMPLANT
NEEDLE INSUFFLATION 14GA 120MM (NEEDLE) ×2 IMPLANT
SCISSORS LAP 5X35 DISP (ENDOMECHANICALS) IMPLANT
SET IRRIG TUBING LAPAROSCOPIC (IRRIGATION / IRRIGATOR) ×2 IMPLANT
SOLUTION ANTI FOG 6CC (MISCELLANEOUS) ×2 IMPLANT
STOPCOCK K 69 2C6206 (IV SETS) ×2 IMPLANT
STRIP CLOSURE SKIN 1/2X4 (GAUZE/BANDAGES/DRESSINGS) IMPLANT
SUT MNCRL AB 4-0 PS2 18 (SUTURE) ×4 IMPLANT
SUT VIC AB 3-0 SH 27 (SUTURE)
SUT VIC AB 3-0 SH 27XBRD (SUTURE) IMPLANT
TACKER 5MM HERNIA 3.5CML NAB (ENDOMECHANICALS) ×2 IMPLANT
TOWEL OR 17X26 10 PK STRL BLUE (TOWEL DISPOSABLE) ×2 IMPLANT
TRAY FOLEY CATH 14FRSI W/METER (CATHETERS) IMPLANT
TRAY LAP CHOLE (CUSTOM PROCEDURE TRAY) ×2 IMPLANT
TROCAR BALLN 12MMX100 BLUNT (TROCAR) ×2 IMPLANT
TROCAR BLADELESS OPT 5 75 (ENDOMECHANICALS) IMPLANT
TROCAR CANNULA W/PORT DUAL 5MM (MISCELLANEOUS) ×2 IMPLANT
TUBING INSUFFLATION 10FT LAP (TUBING) ×2 IMPLANT

## 2012-11-07 NOTE — Anesthesia Preprocedure Evaluation (Addendum)
Anesthesia Evaluation  Patient identified by MRN, date of birth, ID band Patient awake    Reviewed: Allergy & Precautions, H&P , NPO status , Patient's Chart, lab work & pertinent test results  Airway Mallampati: II TM Distance: >3 FB Neck ROM: Full    Dental  (+) Dental Advisory Given and Teeth Intact   Pulmonary neg pulmonary ROS, former smoker,  breath sounds clear to auscultation        Cardiovascular hypertension, Pt. on medications Rhythm:Regular Rate:Normal     Neuro/Psych negative neurological ROS  negative psych ROS   GI/Hepatic Neg liver ROS, GERD-  Medicated,  Endo/Other  negative endocrine ROS  Renal/GU negative Renal ROS     Musculoskeletal negative musculoskeletal ROS (+)   Abdominal   Peds  Hematology negative hematology ROS (+)   Anesthesia Other Findings   Reproductive/Obstetrics                          Anesthesia Physical Anesthesia Plan  ASA: II  Anesthesia Plan: General   Post-op Pain Management:    Induction: Intravenous  Airway Management Planned: Oral ETT  Additional Equipment:   Intra-op Plan:   Post-operative Plan: Extubation in OR  Informed Consent: I have reviewed the patients History and Physical, chart, labs and discussed the procedure including the risks, benefits and alternatives for the proposed anesthesia with the patient or authorized representative who has indicated his/her understanding and acceptance.   Dental advisory given  Plan Discussed with: CRNA  Anesthesia Plan Comments:         Anesthesia Quick Evaluation

## 2012-11-07 NOTE — Transfer of Care (Signed)
Immediate Anesthesia Transfer of Care Note  Patient: Thomas Norris  Procedure(s) Performed: Procedure(s): LAPAROSCOPIC INGUINAL HERNIA (Left) INSERTION OF MESH (Left)  Patient Location: PACU  Anesthesia Type:General  Level of Consciousness: awake, alert  and oriented  Airway & Oxygen Therapy: Patient Spontanous Breathing and Patient connected to face mask oxygen  Post-op Assessment: Report given to PACU RN and Post -op Vital signs reviewed and stable  Post vital signs: Reviewed and stable  Complications: No apparent anesthesia complications

## 2012-11-07 NOTE — Interval H&P Note (Signed)
History and Physical Interval Note:  11/07/2012 12:02 PM  Thomas Norris  has presented today for surgery, with the diagnosis of left inguinal hernia  The goals and the various methods of treatment have been discussed with the patient and family. After consideration of risks, benefits and other options for treatment, the patient has consented to  Procedure(s): LAPAROSCOPIC INGUINAL HERNIA (Left) INSERTION OF MESH (Left) as a surgical intervention .  The patient's history has been reviewed, patient examined, no change in status, stable for surgery.  I have reviewed the patient's chart and labs.  Questions were answered to the patient's satisfaction.     Ernestene Mention

## 2012-11-07 NOTE — Anesthesia Postprocedure Evaluation (Signed)
Anesthesia Post Note  Patient: Thomas Norris  Procedure(s) Performed: Procedure(s) (LRB): LAPAROSCOPIC INGUINAL HERNIA (Left) INSERTION OF MESH (Left)  Anesthesia type: General  Patient location: PACU  Post pain: Pain level controlled  Post assessment: Post-op Vital signs reviewed  Last Vitals: BP 157/77  Pulse 58  Temp(Src) 36.7 C (Oral)  Resp 12  SpO2 95%  Post vital signs: Reviewed  Level of consciousness: sedated  Complications: No apparent anesthesia complications

## 2012-11-07 NOTE — Op Note (Signed)
Patient Name:           Thomas Norris   Date of Surgery:        11/07/2012  Pre op Diagnosis:     Left inguinal hernia   Post op Diagnosis:    Indirect left inguinal hernia  Procedure:                 Laparoscopic, preperitoneal repair of left inguinal hernia with mesh  Surgeon:                     Angelia Mould. Derrell Lolling, M.D., FACS  Assistant:                      None  Operative Indications:   Thomas Norris is a 79 y.o. male. He is referred by Dr. Johnella Moloney for evaluation of a symptomatic left inguinal hernia.  This patient gives a one-year history of a left groin bulge. This has become larger and became quite painful recently when he was playing golf. He feels a lump. He can usually push this back in at night. Examination reveals a small to medium-size left renal hernia that is reducible. No evidence of hernia on the right. Significant past history includes stage IV melanoma managed at [facility] and [facility]. A left radical neck resection by Joaquin Bend. he has responded to immunotherapy for a couple of nodules in his belly and he is healthy and doing very well with no known disease at present. He also has been treated for prostate cancer. Hypertension. GERD. Has allergies and uses inhalers occasionally.      Operative Findings:       The patient had a large lipoma that had protruded into the inguinal canal it had to be dissected completely back and out of the inguinal canal. There was a small edge of peritoneum that was pulled back as well. There was no evidence of direct hernia.  Procedure in Detail:          Following the induction of general endotracheal anesthesia, the bladder was emptied with a silastic  Foley catheter. The abdomen and genitalia were prepped and draped in a sterile fashion. Intravenous antibiotics were given. 0.5% Marcaine with epinephrine was used as local infiltration anesthetic.  Because of his latex allergy we had to change our technique with a balloon Hassan  trocar and a balloon dissector but with these latex free instruments we were able to perform the case. A transverse incision was made at the lower rim of the umbilicus. The fascia was incised transversely exposing the left rectus muscle. The balloon dissector was inserted into the left rectus sheath down to the just above the symphysis pubis in the midline. The video camera was inserted through the [facility] trocar and we were able to insufflate the balloon under direct vision. The balloon deployed mostly on the left side, somewhat asymmetrically. We held this in place for 5 minutes and then deflated and removed the balloon. I inflated the  balloon on the [facility] trocar and connected it to the insufflator at 14 mm of mercury and we got a good pneumoperitoneum.  Camera was inserted. We could see the rectus muscles anteriorly, preperitoneal fat inferiorly. I placed a 5 mm trocar in the midline below the umbilicus I used this to identify and clean off the symphysis pubis and Cooper's ligament on the left. I dissected some of the peritoneum off of the right side until I could  place a 5 mm trocar in the right lower quadrant and be sure that I was going just through the abdominal wall muscles and staying away from the peritoneum. I then dissected the left inguinal area. I pulled  the areolar tissue off of the cord structures and mobilized them. There was a large lipoma that was well up in the inguinal canal I had to slowly dissect this out separating it from its areolar tissue. Ultimately I had completely dissected out and above the level of the anterior superior iliac spine. He really did not have much of a hernia sac but what there was we pulled back and well away from the cord structures. I found no other hernias. There was no bleeding. I brought a 3" x 6" piece UltraPro mesh to the field, slit it on one side  and inserted it. Medially the mesh was solid and laterally the mesh wrapped around the cord structures at the  internal ring. This was secured with a 5 mm proTacker in the usual fashion. Coverage was  good but I was a little bit concerned about opening near the internal ring so I inserted a second piece of ultra Pro mesh and placed out on top of the first piece of mesh which gave excellent coverage both medially and laterally. This was secured with about 4 tacks and was positioned well. Everything else looked fine. The pneumoperitoneum was released. The trocars were removed. The fascia at the umbilicus was closed with figure-of-eight sutures of 0 Vicryl and all the skin incisions were closed with subcuticular sutures of 4-0 Monocryl and Dermabond. Patient tolerated procedure well and was taken to recovery in stable. EBL 15 cc. Counts correct. Complications  none.     Angelia Mould. Derrell Lolling, M.D., FACS General and Minimally Invasive Surgery Breast and Colorectal Surgery  11/07/2012 2:10 PM

## 2012-11-07 NOTE — Progress Notes (Signed)
Patient has been ambulating in hallway after surgery. Tolerating fluids well. Unable to void. Foley catheter removed about 1400 today. Bladder scanned for 113 mls. Will continue to assess.

## 2012-11-08 ENCOUNTER — Encounter (HOSPITAL_COMMUNITY): Payer: Self-pay | Admitting: General Surgery

## 2012-11-26 ENCOUNTER — Encounter (INDEPENDENT_AMBULATORY_CARE_PROVIDER_SITE_OTHER): Payer: Self-pay | Admitting: General Surgery

## 2012-11-26 ENCOUNTER — Ambulatory Visit (INDEPENDENT_AMBULATORY_CARE_PROVIDER_SITE_OTHER): Payer: 59 | Admitting: General Surgery

## 2012-11-26 VITALS — BP 134/68 | HR 68 | Temp 97.9°F | Resp 16 | Ht 70.0 in | Wt 170.4 lb

## 2012-11-26 DIAGNOSIS — K4090 Unilateral inguinal hernia, without obstruction or gangrene, not specified as recurrent: Secondary | ICD-10-CM

## 2012-11-26 NOTE — Patient Instructions (Signed)
You are recovering from your  laparoscopic left inguinal hernia repair without any obvious surgical complications.  You may resume normal activities after July 1. You may play golf after July 1. Stretch a lot before and after any exercise.  Return to see Dr. Derrell Lolling if further problems arise.

## 2012-11-26 NOTE — Progress Notes (Signed)
Patient ID: TERIN CRAGLE, male   DOB: 11/29/1927, 79 y.o.   MRN: [id number] History: This patient underwent laparoscopic repair of a left inguinal hernia with mesh on 11/07/2012. He has done well. Some ecchymoses have resolved. No pain. No bulge.  Exam: Patient looks well. Good spirits. Abdomen soft. Umbilical incision healing normally. Examined standing, left groin feels normal. Minimal thickening. Hernia repair intact. His scrotum and testes feel normal  Assessment: Left inguinal hernia, uneventful recovery following laparoscopic repair with mesh  Plan: Resume normal activities after July 1. Stretching exercises encouraged. Return to see me if further problems arise.   Angelia Mould. Derrell Lolling, M.D., [facility] General and Minimally invasive Surgery Breast and Colorectal Surgery Office:   [phone] Pager:   [phone]

## 2013-01-16 ENCOUNTER — Other Ambulatory Visit: Payer: Self-pay

## 2013-04-05 ENCOUNTER — Ambulatory Visit (INDEPENDENT_AMBULATORY_CARE_PROVIDER_SITE_OTHER): Payer: Medicare Other | Admitting: Ophthalmology

## 2013-04-05 DIAGNOSIS — H2510 Age-related nuclear cataract, unspecified eye: Secondary | ICD-10-CM

## 2013-04-05 DIAGNOSIS — H43819 Vitreous degeneration, unspecified eye: Secondary | ICD-10-CM

## 2013-04-05 DIAGNOSIS — I10 Essential (primary) hypertension: Secondary | ICD-10-CM

## 2013-04-05 DIAGNOSIS — H35039 Hypertensive retinopathy, unspecified eye: Secondary | ICD-10-CM

## 2013-04-05 DIAGNOSIS — H33309 Unspecified retinal break, unspecified eye: Secondary | ICD-10-CM

## 2013-04-05 DIAGNOSIS — H3530 Unspecified macular degeneration: Secondary | ICD-10-CM

## 2013-04-18 ENCOUNTER — Other Ambulatory Visit: Payer: Self-pay

## 2013-04-19 ENCOUNTER — Telehealth (INDEPENDENT_AMBULATORY_CARE_PROVIDER_SITE_OTHER): Payer: Self-pay

## 2013-04-19 NOTE — Telephone Encounter (Signed)
I received some notes on the pt for possible recurrent hernia and noticed he was coming in to see Dr Derrell Lolling in December.  I offered him to come Monday 04/22/2013 at 1:30.  He accepted and was pleased with the sooner appointment.

## 2013-04-22 ENCOUNTER — Encounter (INDEPENDENT_AMBULATORY_CARE_PROVIDER_SITE_OTHER): Payer: Self-pay | Admitting: General Surgery

## 2013-04-22 ENCOUNTER — Ambulatory Visit (INDEPENDENT_AMBULATORY_CARE_PROVIDER_SITE_OTHER): Payer: Managed Care, Other (non HMO) | Admitting: General Surgery

## 2013-04-22 VITALS — BP 140/64 | HR 60 | Temp 99.8°F | Resp 16 | Ht 70.0 in | Wt 174.2 lb

## 2013-04-22 DIAGNOSIS — K4090 Unilateral inguinal hernia, without obstruction or gangrene, not specified as recurrent: Secondary | ICD-10-CM | POA: Insufficient documentation

## 2013-04-22 NOTE — Patient Instructions (Signed)
Your right groin pain and bulge are due to a new right inguinal hernia.  This is unrelated to the left inguinal hernia repair that we performed. That hernia repair is intact and seems to be doing well.  We have discussed your options, and you  will be scheduled for open repair of the right inguinal hernia with mesh in the near future.     Inguinal Hernia, Adult Muscles help keep everything in the body in its proper place. But if a weak spot in the muscles develops, something can poke through. That is called a hernia. When this happens in the lower part of the belly (abdomen), it is called an inguinal hernia. (It takes its name from a part of the body in this region called the inguinal canal.) A weak spot in the wall of muscles lets some fat or part of the small intestine bulge through. An inguinal hernia can develop at any age. Men get them more often than women. CAUSES  In adults, an inguinal hernia develops over time.  It can be triggered by:  Suddenly straining the muscles of the lower abdomen.  Lifting heavy objects.  Straining to have a bowel movement. Difficult bowel movements (constipation) can lead to this.  Constant coughing. This may be caused by smoking or lung disease.  Being overweight.  Being pregnant.  Working at a job that requires long periods of standing or heavy lifting.  Having had an inguinal hernia before. One type can be an emergency situation. It is called a strangulated inguinal hernia. It develops if part of the small intestine slips through the weak spot and cannot get back into the abdomen. The blood supply can be cut off. If that happens, part of the intestine may die. This situation requires emergency surgery. SYMPTOMS  Often, a small inguinal hernia has no symptoms. It is found when a healthcare provider does a physical exam. Larger hernias usually have symptoms.   In adults, symptoms may include:  A lump in the groin. This is easier to see when the  person is standing. It might disappear when lying down.  In men, a lump in the scrotum.  Pain or burning in the groin. This occurs especially when lifting, straining or coughing.  A dull ache or feeling of pressure in the groin.  Signs of a strangulated hernia can include:  A bulge in the groin that becomes very painful and tender to the touch.  A bulge that turns red or purple.  Fever, nausea and vomiting.  Inability to have a bowel movement or to pass gas. DIAGNOSIS  To decide if you have an inguinal hernia, a healthcare provider will probably do a physical examination.  This will include asking questions about any symptoms you have noticed.  The healthcare provider might feel the groin area and ask you to cough. If an inguinal hernia is felt, the healthcare provider may try to slide it back into the abdomen.  Usually no other tests are needed. TREATMENT  Treatments can vary. The size of the hernia makes a difference. Options include:  Watchful waiting. This is often suggested if the hernia is small and you have had no symptoms.  No medical procedure will be done unless symptoms develop.  You will need to watch closely for symptoms. If any occur, contact your healthcare provider right away.  Surgery. This is used if the hernia is larger or you have symptoms.  Open surgery. This is usually an outpatient procedure (you will not  stay overnight in a hospital). An cut (incision) is made through the skin in the groin. The hernia is put back inside the abdomen. The weak area in the muscles is then repaired by herniorrhaphy or hernioplasty. Herniorrhaphy: in this type of surgery, the weak muscles are sewn back together. Hernioplasty: a patch or mesh is used to close the weak area in the abdominal wall.  Laparoscopy. In this procedure, a surgeon makes small incisions. A thin tube with a tiny video camera (called a laparoscope) is put into the abdomen. The surgeon repairs the hernia  with mesh by looking with the video camera and using two long instruments. HOME CARE INSTRUCTIONS   After surgery to repair an inguinal hernia:  You will need to take pain medicine prescribed by your healthcare provider. Follow all directions carefully.  You will need to take care of the wound from the incision.  Your activity will be restricted for awhile. This will probably include no heavy lifting for several weeks. You also should not do anything too active for a few weeks. When you can return to work will depend on the type of job that you have.  During "watchful waiting" periods, you should:  Maintain a healthy weight.  Eat a diet high in fiber (fruits, vegetables and whole grains).  Drink plenty of fluids to avoid constipation. This means drinking enough water and other liquids to keep your urine clear or pale yellow.  Do not lift heavy objects.  Do not stand for long periods of time.  Quit smoking. This should keep you from developing a frequent cough. SEEK MEDICAL CARE IF:   A bulge develops in your groin area.  You feel pain, a burning sensation or pressure in the groin. This might be worse if you are lifting or straining.  You develop a fever of more than 100.5 F (38.1 C). SEEK IMMEDIATE MEDICAL CARE IF:   Pain in the groin increases suddenly.  A bulge in the groin gets bigger suddenly and does not go down.  For men, there is sudden pain in the scrotum. Or, the size of the scrotum increases.  A bulge in the groin area becomes red or purple and is painful to touch.  You have nausea or vomiting that does not go away.  You feel your heart beating much faster than normal.  You cannot have a bowel movement or pass gas.  You develop a fever of more than 102.0 F (38.9 C). Document Released: 10/16/2008 Document Revised: 08/22/2011 Document Reviewed: 10/16/2008 [facility] Patient Information 2014 [place], [place].

## 2013-04-22 NOTE — Progress Notes (Signed)
Patient ID: Thomas Payeur Sr., male   DOB: 1927/10/01, 79 y.o.   MRN: [id number]  Chief Complaint  Patient presents with  . Follow-up    reck ing hernia    HPI Thomas Hayashi Sr. is a 79 y.o. male.  He is referred back to me by Dr. Johnella Moloney for evaluation of right groin pain possible hernia.  This patient underwent laparoscopic repair of a left inguinal hernia with mesh on 11/07/2012. He has no symptoms on the left side.  For several weeks he's noticed pain and swelling in his right groin. It is now interfering with his golf game and he had to stop playing recently. No nausea vomiting or abdominal pain.  Past history is significant for stage IV melanoma managed at [facility] and at [facility] in [place]. He had a radical neck resection, Dr. Evette Cristal at  [facility]. He has responded to immunotherapy for a couple of nodules in his belly and he is healthy and doing well and thought to be disease free. He also has been treated for prostate cancer. Hypertension. GERD. Allergies he uses inhalers occasionally.  Social he is very active. He is still working but getting ready to retire. He says that he is moving to [place] in 2015. He would like something done about his new, right inguinal hernia.  HPI  Past Medical History  Diagnosis Date  . Arthritis   . Inguinal hernia   . GERD (gastroesophageal reflux disease)   . Hypertension   . Cancer     Melanoma stage 4  . Prostate ca     Past Surgical History  Procedure Laterality Date  . Melanoma excision  2003, 2007, 2008    left side lymph node excision  . Bladder surgery  2007  . Rectal surgery  2012    rectal abscess  . Prostate biopsy      every 2 years  . Inguinal hernia repair Left 11/07/2012    Procedure: LAPAROSCOPIC INGUINAL HERNIA;  Surgeon: Ernestene Mention, MD;  Location: [facility];  Service: General;  Laterality: Left;  . Insertion of mesh Left 11/07/2012    Procedure: INSERTION OF MESH;  Surgeon: Ernestene Mention, MD;  Location:  [facility];  Service: General;  Laterality: Left;    History reviewed. No pertinent family history.  Social History History  Substance Use Topics  . Smoking status: Former Smoker -- 1.00 packs/day for 20 years    Types: Cigarettes    Quit date: 08/28/1975  . Smokeless tobacco: Never Used  . Alcohol Use: 1.2 oz/week    2 Glasses of wine per week     Comment: 2 glasses wine week    Allergies  Allergen Reactions  . Latex Rash    Current Outpatient Prescriptions  Medication Sig Dispense Refill  . acetaminophen (TYLENOL) 325 MG tablet Take 325 mg by mouth every 6 (six) hours as needed for pain.      . Cholecalciferol (VITAMIN D) 2000 UNITS tablet Take 2,000 Units by mouth daily.      Marland Kitchen dexlansoprazole (DEXILANT) 60 MG capsule Take 60 mg by mouth daily as needed (for heartburn).       . hydrochlorothiazide (HYDRODIURIL) 25 MG tablet Take 12.5 mg by mouth every morning.      Marland Kitchen ibuprofen (ADVIL,MOTRIN) 200 MG tablet Take 200 mg by mouth daily.      . Multiple Vitamin (MULTIVITAMIN) tablet Take 1 tablet by mouth daily.      . saw palmetto 80 MG  capsule Take 80 mg by mouth daily.       . tamsulosin (FLOMAX) 0.4 MG CAPS Take 0.4 mg by mouth every evening.       . Turmeric, Curcuma Longa, (CURCUMIN EXTRACT) POWD 1 capsule by Does not apply route daily.      . valsartan (DIOVAN) 80 MG tablet Take 80 mg by mouth every morning.       No current facility-administered medications for this visit.    Review of Systems Review of Systems  Constitutional: Negative for fever, chills and unexpected weight change.  HENT: Negative for congestion, hearing loss, sore throat, trouble swallowing and voice change.   Eyes: Negative for visual disturbance.  Respiratory: Negative for cough and wheezing.   Cardiovascular: Negative for chest pain, palpitations and leg swelling.  Gastrointestinal: Negative for nausea, vomiting, abdominal pain, diarrhea, constipation, blood in stool, abdominal distention, anal  bleeding and rectal pain.  Genitourinary: Negative for hematuria and difficulty urinating.  Musculoskeletal: Negative for arthralgias.  Skin: Negative for rash and wound.  Neurological: Negative for seizures, syncope, weakness and headaches.  Hematological: Negative for adenopathy. Does not bruise/bleed easily.  Psychiatric/Behavioral: Negative for confusion.    Blood pressure 140/64, pulse 60, temperature 99.8 F (37.7 C), temperature source Oral, resp. rate 16, height 5\' 10"  (1.778 m), weight 174 lb 3.2 oz (79.017 kg).  Physical Exam Physical Exam  Constitutional: He is oriented to person, place, and time. He appears well-developed and well-nourished. No distress.  HENT:  Head: Normocephalic.  Nose: Nose normal.  Mouth/Throat: No oropharyngeal exudate.  Eyes: Conjunctivae and EOM are normal. Pupils are equal, round, and reactive to light. Right eye exhibits no discharge. Left eye exhibits no discharge. No scleral icterus.  Neck: Normal range of motion. Neck supple. No JVD present. No tracheal deviation present. No thyromegaly present.  Defect left neck from radical neck dissection. Voice is normal.no neck mass or adenopathy.  Cardiovascular: Normal rate, regular rhythm, normal heart sounds and intact distal pulses.   No murmur heard. Pulmonary/Chest: Effort normal and breath sounds normal. No stridor. No respiratory distress. He has no wheezes. He has no rales. He exhibits no tenderness.  Abdominal: Soft. Bowel sounds are normal. He exhibits no distension and no mass. There is no tenderness. There is no rebound and no guarding.  Well-healed infraumbilical incision. No hernia there. Nontender.  Genitourinary:  Small to medium sized right inguinal hernia, does not extend into the scrotum. Reducible. No evidence of hernia on the left. Examined supine and standing. No scrotal mass.  Musculoskeletal: Normal range of motion. He exhibits no edema and no tenderness.  Lymphadenopathy:    He  has no cervical adenopathy.  Neurological: He is alert and oriented to person, place, and time. He has normal reflexes. Coordination normal.  Skin: Skin is warm and dry. No rash noted. He is not diaphoretic. No erythema. No pallor.  Psychiatric: He has a normal mood and affect. His behavior is normal. Judgment and thought content normal.    Data Reviewed Old records. Office notes from [place].  Assessment    New right inguinal hernia. This is not a recurrent hernia. It is a first-time hernia. Reducible.  Status post laparoscopic repair left inguinal hernia with mesh. Repair intact and asymptomatic on the left side  Hypertension  Stage IV metastases metastatic melanoma, status post left radical neck dissection and immunotherapy, thought to be disease free at present  Prostate cancer, treated, thought to be disease free  GERD  Allergies treated  with PRN  inhalers  Incidental gallstones     Plan    He would like to proceed with repair of his right inguinal hernia with mesh at this time because he is symptomatic and it is interfering with his activities. That is appropriate from a surgical and medical standpoint  He will be scheduled for open repair of his right inguinal hernia with mesh.  I discussed the indications, details, techniques, and numerous risks of the surgery with him. He is aware of the risk of bleeding, infection, recurrence, nerve damage chronic pain, injury to adjacent organs such as the intestine her bladder was major reconstructive surgery. 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 He understands all these issues. He agrees with this plan.        Angelia Mould. Derrell Lolling, M.D., [facility] General and Minimally invasive Surgery Breast and Colorectal Surgery Office:   [phone] Pager:   [phone]  04/22/2013, 2:22 PM

## 2013-05-02 ENCOUNTER — Encounter (HOSPITAL_COMMUNITY): Payer: Self-pay | Admitting: Pharmacy Technician

## 2013-05-06 ENCOUNTER — Inpatient Hospital Stay (HOSPITAL_COMMUNITY)
Admission: RE | Admit: 2013-05-06 | Discharge: 2013-05-06 | Disposition: A | Discharge status: Home / Self Care | Payer: 59 | Source: Ambulatory Visit

## 2013-05-06 NOTE — Patient Instructions (Signed)
YOUR SURGERY IS SCHEDULED AT [facility]  ON:  Wednesday  12/3  REPORT TO  SHORT STAY CENTER AT:  11:30 AM      PHONE # FOR SHORT STAY IS [phone]  DO NOT EAT OR DRINK ANYTHING AFTER MIDNIGHT THE NIGHT BEFORE YOUR SURGERY.  YOU MAY BRUSH YOUR TEETH, RINSE OUT YOUR MOUTH--BUT NO WATER, NO FOOD, NO CHEWING GUM, NO MINTS, NO CANDIES, NO CHEWING TOBACCO.  PLEASE TAKE THE FOLLOWING MEDICATIONS THE AM OF YOUR SURGERY WITH A FEW SIPS OF WATER:  IF YOU USE INHALERS--USE YOUR INHALERS THE AM OF YOUR SURGERY AND BRING INHALERS TO THE HOSPITAL.   IF YOU ARE DIABETIC:  DO NOT TAKE ANY DIABETIC MEDICATIONS THE AM OF YOUR SURGERY.  IF YOU TAKE INSULIN IN THE EVENINGS--PLEASE ONLY TAKE 1/2 NORMAL EVENING DOSE THE NIGHT BEFORE YOUR SURGERY.  NO INSULIN THE AM OF YOUR SURGERY. IF YOU HAVE SLEEP APNEA AND USE CPAP OR BIPAP--PLEASE BRING THE MASK AND THE TUBING.  DO NOT BRING YOUR MACHINE.  DO NOT BRING VALUABLES, MONEY, CREDIT CARDS.  DO NOT WEAR JEWELRY, MAKE-UP, NAIL POLISH AND NO METAL PINS OR CLIPS IN YOUR HAIR. CONTACT LENS, DENTURES / PARTIALS, GLASSES SHOULD NOT BE WORN TO SURGERY AND IN MOST CASES-HEARING AIDS WILL NEED TO BE REMOVED.  BRING YOUR GLASSES CASE, ANY EQUIPMENT NEEDED FOR YOUR CONTACT LENS. FOR PATIENTS ADMITTED TO THE HOSPITAL--CHECK OUT TIME THE DAY OF DISCHARGE IS 11:00 AM.  ALL INPATIENT ROOMS ARE PRIVATE - WITH BATHROOM, TELEPHONE, TELEVISION AND WIFI INTERNET.  IF YOU ARE BEING DISCHARGED THE SAME DAY OF YOUR SURGERY--YOU CAN NOT DRIVE YOURSELF HOME--AND SHOULD NOT GO HOME ALONE BY TAXI OR BUS.  NO DRIVING OR OPERATING MACHINERY, DO NOT MAKE LEGAL DECISIONS FOR 24 HOURS FOLLOWING ANESTHESIA / PAIN MEDICATIONS.  PLEASE MAKE ARRANGEMENTS FOR SOMEONE TO BE WITH YOU AT HOME THE FIRST 24 HOURS AFTER SURGERY. RESPONSIBLE DRIVER'S NAME / PHONE                                                   PLEASE READ OVER ANY  FACT SHEETS THAT YOU WERE GIVEN: MRSA INFORMATION, BLOOD  TRANSFUSION INFORMATION, INCENTIVE SPIROMETER INFORMATION.  FAILURE TO FOLLOW THESE INSTRUCTIONS MAY RESULT IN THE CANCELLATION OF YOUR SURGERY. PLEASE BE AWARE THAT YOU MAY NEED ADDITIONAL BLOOD DRAWN DAY OF YOUR SURGERY  PATIENT SIGNATURE_________________________________

## 2013-05-08 ENCOUNTER — Encounter (HOSPITAL_COMMUNITY)
Admission: RE | Admit: 2013-05-08 | Discharge: 2013-05-08 | Disposition: A | Discharge status: Home / Self Care | Payer: PRIVATE HEALTH INSURANCE | Source: Ambulatory Visit | Attending: General Surgery | Admitting: General Surgery

## 2013-05-08 ENCOUNTER — Encounter (HOSPITAL_COMMUNITY): Payer: Self-pay

## 2013-05-08 DIAGNOSIS — Z01818 Encounter for other preprocedural examination: Secondary | ICD-10-CM | POA: Insufficient documentation

## 2013-05-08 DIAGNOSIS — Z01812 Encounter for preprocedural laboratory examination: Secondary | ICD-10-CM | POA: Insufficient documentation

## 2013-05-08 HISTORY — DX: Rash and other nonspecific skin eruption: R21

## 2013-05-08 LAB — CBC WITH DIFFERENTIAL/PLATELET
Eosinophils Relative: 3 % (ref 0–5)
HCT: 39.9 % (ref 39.0–52.0)
Hemoglobin: 13.9 g/dL (ref 13.0–17.0)
Lymphocytes Relative: 18 % (ref 12–46)
MCHC: 34.8 g/dL (ref 30.0–36.0)
MCV: 89.3 fL (ref 78.0–100.0)
Monocytes Absolute: 0.7 10*3/uL (ref 0.1–1.0)
Monocytes Relative: 11 % (ref 3–12)
Neutro Abs: 4.3 10*3/uL (ref 1.7–7.7)
RDW: 13.6 % (ref 11.5–15.5)
WBC: 6.4 10*3/uL (ref 4.0–10.5)

## 2013-05-08 LAB — URINALYSIS, ROUTINE W REFLEX MICROSCOPIC
Leukocytes, UA: NEGATIVE
Nitrite: NEGATIVE
Protein, ur: NEGATIVE mg/dL
Specific Gravity, Urine: 1.021 (ref 1.005–1.030)
Urobilinogen, UA: 0.2 mg/dL (ref 0.0–1.0)

## 2013-05-08 LAB — COMPREHENSIVE METABOLIC PANEL
BUN: 21 mg/dL (ref 6–23)
CO2: 29 mEq/L (ref 19–32)
Calcium: 9.2 mg/dL (ref 8.4–10.5)
Chloride: 100 mEq/L (ref 96–112)
Creatinine, Ser: 0.96 mg/dL (ref 0.50–1.35)
GFR calc Af Amer: 86 mL/min — ABNORMAL LOW (ref >90)
GFR calc non Af Amer: 75 mL/min — ABNORMAL LOW (ref >90)
Total Bilirubin: 0.6 mg/dL (ref 0.3–1.2)

## 2013-05-08 MED ORDER — CHLORHEXIDINE GLUCONATE 4 % EX LIQD: 1.0000 "application " | Freq: Once | CUTANEOUS | Status: DC

## 2013-05-08 NOTE — Pre-Procedure Instructions (Signed)
CXR AND EKG REPORTS ARE IN EPIC FROM 10/30/22.

## 2013-05-08 NOTE — Patient Instructions (Addendum)
YOUR SURGERY IS SCHEDULED AT [facility]  ON:  Wednesday  12/3  REPORT TO  SHORT STAY CENTER AT:   11:30 AM      PHONE # FOR SHORT STAY IS [phone]  NO ASPIRIN, HERBALS 5 DAYS BEFORE SURGERY.  DO NOT EAT  ANYTHING AFTER MIDNIGHT THE NIGHT BEFORE YOUR SURGERY.   NO FOOD, NO CHEWING GUM, NO MINTS, NO CANDIES, NO CHEWING TOBACCO. YOU MAY HAVE CLEAR LIQUIDS TO DRINK FROM MIDNIGHT UNTIL 7:30 AM DAY OF SURGERY - LIKE WATER, COFFEE- BLACK, GATORADE.  NOTHING TO DRINK AFTER 7:30 AM DAY OF SURGERY.  PLEASE TAKE THE FOLLOWING MEDICATIONS THE AM OF YOUR SURGERY WITH A FEW SIPS OF WATER:  DEXILANT   DO NOT BRING VALUABLES, MONEY, CREDIT CARDS.  DO NOT WEAR JEWELRY, MAKE-UP, NAIL POLISH AND NO METAL PINS OR CLIPS IN YOUR HAIR. CONTACT LENS, DENTURES / PARTIALS, GLASSES SHOULD NOT BE WORN TO SURGERY AND IN MOST CASES-HEARING AIDS WILL NEED TO BE REMOVED.  BRING YOUR GLASSES CASE, ANY EQUIPMENT NEEDED FOR YOUR CONTACT LENS. FOR PATIENTS ADMITTED TO THE HOSPITAL--CHECK OUT TIME THE DAY OF DISCHARGE IS 11:00 AM.  ALL INPATIENT ROOMS ARE PRIVATE - WITH BATHROOM, TELEPHONE, TELEVISION AND WIFI INTERNET.  IF YOU ARE BEING DISCHARGED THE SAME DAY OF YOUR SURGERY--YOU CAN NOT DRIVE YOURSELF HOME--AND SHOULD NOT GO HOME ALONE BY TAXI OR BUS.  NO DRIVING OR OPERATING MACHINERY, DO NOT MAKE LEGAL DECISIONS FOR 24 HOURS FOLLOWING ANESTHESIA / PAIN MEDICATIONS.  PLEASE MAKE ARRANGEMENTS FOR SOMEONE TO BE WITH YOU AT HOME THE FIRST 24 HOURS AFTER SURGERY. RESPONSIBLE DRIVER'S NAME / PHONE  [name] WILL BE WITH HIM DAY OF SURGERY.                                                   FAILURE TO FOLLOW THESE INSTRUCTIONS MAY RESULT IN THE CANCELLATION OF YOUR SURGERY. PLEASE BE AWARE THAT YOU MAY NEED ADDITIONAL BLOOD DRAWN DAY OF YOUR SURGERY  PATIENT SIGNATURE_________________________________

## 2013-05-14 NOTE — H&P (Signed)
Thomas Killian Sr.   MRN:  [id number]   Description: 79 year old male  Provider: Ernestene Mention, MD  Department: [facility]          Diagnoses      Right inguinal hernia    -  Primary      550.90          Current Vitals - Last Recorded      BP Pulse Temp(Src) Resp Ht Wt      140/64 60 99.8 F (37.7 C) (Oral) 16 5\' 10"  (1.778 m) 174 lb 3.2 oz (79.017 kg)    BMI 25.00 kg/m2                     History and Physical      Ernestene Mention, MD       Status: Signed            Patient ID: Thomas Killian Sr., male   DOB: [birth year], 79 y.o.   MRN: [id number]               HPI Thomas Grumbine Sr. is a 79 y.o. male.  He is referred back to me by Dr. Johnella Moloney for evaluation of right groin pain possible hernia.   This patient underwent laparoscopic repair of a left inguinal hernia with mesh on 11/07/2012. He has no symptoms on the left side.   For several weeks he's noticed pain and swelling in his right groin. It is now interfering with his golf game and he had to stop playing recently. No nausea vomiting or abdominal pain.   Past history is significant for stage IV melanoma managed at [facility] and at [facility] in [place]. He had a radical neck resection, Dr. Evette Cristal at  [facility]. He has responded to immunotherapy for a couple of nodules in his belly and he is healthy and doing well and thought to be disease free. He also has been treated for prostate cancer. Hypertension. GERD. Allergies he uses inhalers occasionally.   Social he is very active. He is still working but getting ready to retire. He says that he is moving to [place] in 2015. He would like something done about his new, right inguinal hernia.         Past Medical History   Diagnosis  Date   .  Arthritis     .  Inguinal hernia     .  GERD (gastroesophageal reflux disease)     .  Hypertension     .  Cancer         Melanoma stage 4   .  Prostate ca           Past Surgical History    Procedure  Laterality  Date   .  Melanoma excision    2003, 2007, 2008       left side lymph node excision   .  Bladder surgery    2007   .  Rectal surgery    2012       rectal abscess   .  Prostate biopsy           every 2 years   .  Inguinal hernia repair  Left  11/07/2012       Procedure: LAPAROSCOPIC INGUINAL HERNIA;  Surgeon: Ernestene Mention, MD;  Location: [facility];  Service: General;  Laterality: Left;   .  Insertion of mesh  Left  11/07/2012  Procedure: INSERTION OF MESH;  Surgeon: Ernestene Mention, MD;  Location: [facility];  Service: General;  Laterality: Left;        History reviewed. No pertinent family history.   Social History History   Substance Use Topics   .  Smoking status:  Former Smoker -- 1.00 packs/day for 20 years       Types:  Cigarettes       Quit date:  08/28/1975   .  Smokeless tobacco:  Never Used   .  Alcohol Use:  1.2 oz/week       2 Glasses of wine per week         Comment: 2 glasses wine week         Allergies   Allergen  Reactions   .  Latex  Rash         Current Outpatient Prescriptions   Medication  Sig  Dispense  Refill   .  acetaminophen (TYLENOL) 325 MG tablet  Take 325 mg by mouth every 6 (six) hours as needed for pain.         .  Cholecalciferol (VITAMIN D) 2000 UNITS tablet  Take 2,000 Units by mouth daily.         Marland Kitchen  dexlansoprazole (DEXILANT) 60 MG capsule  Take 60 mg by mouth daily as needed (for heartburn).          .  hydrochlorothiazide (HYDRODIURIL) 25 MG tablet  Take 12.5 mg by mouth every morning.         Marland Kitchen  ibuprofen (ADVIL,MOTRIN) 200 MG tablet  Take 200 mg by mouth daily.         .  Multiple Vitamin (MULTIVITAMIN) tablet  Take 1 tablet by mouth daily.         .  saw palmetto 80 MG capsule  Take 80 mg by mouth daily.          .  tamsulosin (FLOMAX) 0.4 MG CAPS  Take 0.4 mg by mouth every evening.          .  Turmeric, Curcuma Longa, (CURCUMIN EXTRACT) POWD  1 capsule by Does not apply route daily.         .   valsartan (DIOVAN) 80 MG tablet  Take 80 mg by mouth every morning.             No current facility-administered medications for this visit.        Review of Systems   Constitutional: Negative for fever, chills and unexpected weight change.  HENT: Negative for congestion, hearing loss, sore throat, trouble swallowing and voice change.   Eyes: Negative for visual disturbance.  Respiratory: Negative for cough and wheezing.   Cardiovascular: Negative for chest pain, palpitations and leg swelling.  Gastrointestinal: Negative for nausea, vomiting, abdominal pain, diarrhea, constipation, blood in stool, abdominal distention, anal bleeding and rectal pain.  Genitourinary: Negative for hematuria and difficulty urinating.  Musculoskeletal: Negative for arthralgias.  Skin: Negative for rash and wound.  Neurological: Negative for seizures, syncope, weakness and headaches.  Hematological: Negative for adenopathy. Does not bruise/bleed easily.  Psychiatric/Behavioral: Negative for confusion.      Blood pressure 140/64, pulse 60, temperature 99.8 F (37.7 C), temperature source Oral, resp. rate 16, height 5\' 10"  (1.778 m), weight 174 lb 3.2 oz (79.017 kg).   Physical Exam   Constitutional: He is oriented to person, place, and time. He appears well-developed and well-nourished. No distress.  HENT:   Head: Normocephalic.  Nose: Nose normal.   Mouth/Throat: No oropharyngeal exudate.  Eyes: Conjunctivae and EOM are normal. Pupils are equal, round, and reactive to light. Right eye exhibits no discharge. Left eye exhibits no discharge. No scleral icterus.  Neck: Normal range of motion. Neck supple. No JVD present. No tracheal deviation present. No thyromegaly present.  Defect left neck from radical neck dissection. Voice is normal.no neck mass or adenopathy.  Cardiovascular: Normal rate, regular rhythm, normal heart sounds and intact distal pulses.    No murmur heard. Pulmonary/Chest: Effort  normal and breath sounds normal. No stridor. No respiratory distress. He has no wheezes. He has no rales. He exhibits no tenderness.  Abdominal: Soft. Bowel sounds are normal. He exhibits no distension and no mass. There is no tenderness. There is no rebound and no guarding.  Well-healed infraumbilical incision. No hernia there. Nontender.  Genitourinary:  Small to medium sized right inguinal hernia, does not extend into the scrotum. Reducible. No evidence of hernia on the left. Examined supine and standing. No scrotal mass.  Musculoskeletal: Normal range of motion. He exhibits no edema and no tenderness.  Lymphadenopathy:    He has no cervical adenopathy.  Neurological: He is alert and oriented to person, place, and time. He has normal reflexes. Coordination normal.  Skin: Skin is warm and dry. No rash noted. He is not diaphoretic. No erythema. No pallor.  Psychiatric: He has a normal mood and affect. His behavior is normal. Judgment and thought content normal.      Data Reviewed Old records. Office notes from [place].   Assessment    New right inguinal hernia. This is not a recurrent hernia. It is a first-time hernia. Reducible.   Status post laparoscopic repair left inguinal hernia with mesh. Repair intact and asymptomatic on the left side   Hypertension   Stage IV metastases metastatic melanoma, status post left radical neck dissection and immunotherapy, thought to be disease free at present   Prostate cancer, treated, thought to be disease free   GERD   Allergies treated with PRN  inhalers   Incidental gallstones      Plan    He would like to proceed with repair of his right inguinal hernia with mesh at this time because he is symptomatic and it is interfering with his activities. That is appropriate from a surgical and medical standpoint   He will be scheduled for open repair of his right inguinal hernia with mesh.   I discussed the indications,  details, techniques, and numerous risks of the surgery with him. He is aware of the risk of bleeding, infection, recurrence, nerve damage chronic pain, injury to adjacent organs such as the intestine her bladder was major reconstructive surgery. 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 He understands all these issues. He agrees with this plan.           Angelia Mould. Derrell Lolling, M.D., [facility] General and Minimally invasive Surgery Breast and Colorectal Surgery Office:   [phone] Pager:   [phone]

## 2013-05-15 ENCOUNTER — Encounter (HOSPITAL_COMMUNITY)
Admission: RE | Disposition: A | Discharge status: Home / Self Care | Payer: Self-pay | Source: Ambulatory Visit | Attending: General Surgery

## 2013-05-15 ENCOUNTER — Ambulatory Visit (HOSPITAL_COMMUNITY)
Admission: RE | Admit: 2013-05-15 | Discharge: 2013-05-15 | Disposition: A | Discharge status: Home / Self Care | Payer: PRIVATE HEALTH INSURANCE | Source: Ambulatory Visit | Attending: General Surgery | Admitting: General Surgery

## 2013-05-15 ENCOUNTER — Encounter (HOSPITAL_COMMUNITY): Payer: PRIVATE HEALTH INSURANCE | Admitting: Anesthesiology

## 2013-05-15 ENCOUNTER — Ambulatory Visit (HOSPITAL_COMMUNITY): Payer: PRIVATE HEALTH INSURANCE | Admitting: Anesthesiology

## 2013-05-15 ENCOUNTER — Encounter (HOSPITAL_COMMUNITY): Payer: Self-pay | Admitting: *Deleted

## 2013-05-15 DIAGNOSIS — Z8546 Personal history of malignant neoplasm of prostate: Secondary | ICD-10-CM | POA: Insufficient documentation

## 2013-05-15 DIAGNOSIS — Z79899 Other long term (current) drug therapy: Secondary | ICD-10-CM | POA: Insufficient documentation

## 2013-05-15 DIAGNOSIS — K219 Gastro-esophageal reflux disease without esophagitis: Secondary | ICD-10-CM | POA: Insufficient documentation

## 2013-05-15 DIAGNOSIS — I10 Essential (primary) hypertension: Secondary | ICD-10-CM | POA: Insufficient documentation

## 2013-05-15 DIAGNOSIS — K4090 Unilateral inguinal hernia, without obstruction or gangrene, not specified as recurrent: Secondary | ICD-10-CM

## 2013-05-15 DIAGNOSIS — Z87891 Personal history of nicotine dependence: Secondary | ICD-10-CM | POA: Insufficient documentation

## 2013-05-15 DIAGNOSIS — Z85820 Personal history of malignant melanoma of skin: Secondary | ICD-10-CM | POA: Insufficient documentation

## 2013-05-15 HISTORY — PX: INGUINAL HERNIA REPAIR: SHX194

## 2013-05-15 HISTORY — PX: INSERTION OF MESH: SHX5868

## 2013-05-15 SURGERY — REPAIR, HERNIA, INGUINAL, ADULT
Anesthesia: General | Site: Abdomen | Laterality: Right

## 2013-05-15 MED ORDER — CEFAZOLIN SODIUM-DEXTROSE 2-3 GM-% IV SOLR
INTRAVENOUS | Status: AC
  Filled 2013-05-15: qty 50

## 2013-05-15 MED ORDER — CISATRACURIUM BESYLATE (PF) 10 MG/5ML IV SOLN
INTRAVENOUS | Status: DC | PRN
  Administered 2013-05-15: 10 mg via INTRAVENOUS

## 2013-05-15 MED ORDER — PHENYLEPHRINE 40 MCG/ML (10ML) SYRINGE FOR IV PUSH (FOR BLOOD PRESSURE SUPPORT)
PREFILLED_SYRINGE | INTRAVENOUS | Status: AC
  Filled 2013-05-15: qty 10

## 2013-05-15 MED ORDER — LACTATED RINGERS IV SOLN: INTRAVENOUS | Status: DC

## 2013-05-15 MED ORDER — PHENYLEPHRINE HCL 10 MG/ML IJ SOLN
INTRAMUSCULAR | Status: DC | PRN
  Administered 2013-05-15: 40 ug via INTRAVENOUS
  Administered 2013-05-15 (×2): 80 ug via INTRAVENOUS

## 2013-05-15 MED ORDER — FENTANYL CITRATE 0.05 MG/ML IJ SOLN
INTRAMUSCULAR | Status: AC
  Filled 2013-05-15: qty 2

## 2013-05-15 MED ORDER — ONDANSETRON HCL 4 MG/2ML IJ SOLN
INTRAMUSCULAR | Status: AC
  Filled 2013-05-15: qty 2

## 2013-05-15 MED ORDER — FENTANYL CITRATE 0.05 MG/ML IJ SOLN
25.0000 ug | INTRAMUSCULAR | Status: DC | PRN
  Administered 2013-05-15: 50 ug via INTRAVENOUS

## 2013-05-15 MED ORDER — 0.9 % SODIUM CHLORIDE (POUR BTL) OPTIME
TOPICAL | Status: DC | PRN
  Administered 2013-05-15: 1000 mL

## 2013-05-15 MED ORDER — HYDROCODONE-ACETAMINOPHEN 5-325 MG PO TABS: 1.0000 | ORAL_TABLET | Freq: Four times a day (QID) | ORAL | Status: DC | PRN

## 2013-05-15 MED ORDER — FENTANYL CITRATE 0.05 MG/ML IJ SOLN
INTRAMUSCULAR | Status: AC
  Filled 2013-05-15: qty 5

## 2013-05-15 MED ORDER — PROPOFOL 10 MG/ML IV BOLUS
INTRAVENOUS | Status: AC
  Filled 2013-05-15: qty 20

## 2013-05-15 MED ORDER — DEXAMETHASONE SODIUM PHOSPHATE 10 MG/ML IJ SOLN
INTRAMUSCULAR | Status: AC
  Filled 2013-05-15: qty 1

## 2013-05-15 MED ORDER — BUPIVACAINE-EPINEPHRINE 0.5% -1:200000 IJ SOLN
INTRAMUSCULAR | Status: AC
  Filled 2013-05-15: qty 1

## 2013-05-15 MED ORDER — FENTANYL CITRATE 0.05 MG/ML IJ SOLN
INTRAMUSCULAR | Status: DC | PRN
  Administered 2013-05-15: 100 ug via INTRAVENOUS

## 2013-05-15 MED ORDER — CEFAZOLIN SODIUM-DEXTROSE 2-3 GM-% IV SOLR
2.0000 g | INTRAVENOUS | Status: AC
  Administered 2013-05-15: 2 g via INTRAVENOUS

## 2013-05-15 MED ORDER — CISATRACURIUM BESYLATE 20 MG/10ML IV SOLN
INTRAVENOUS | Status: AC
  Filled 2013-05-15: qty 10

## 2013-05-15 MED ORDER — LACTATED RINGERS IV SOLN
INTRAVENOUS | Status: DC
  Administered 2013-05-15: 1000 mL via INTRAVENOUS

## 2013-05-15 MED ORDER — BUPIVACAINE-EPINEPHRINE 0.5% -1:200000 IJ SOLN
INTRAMUSCULAR | Status: DC | PRN
  Administered 2013-05-15: 20 mL

## 2013-05-15 MED ORDER — PROPOFOL 10 MG/ML IV BOLUS
INTRAVENOUS | Status: DC | PRN
  Administered 2013-05-15: 150 mg via INTRAVENOUS

## 2013-05-15 MED ORDER — LACTATED RINGERS IV SOLN
INTRAVENOUS | Status: DC | PRN
  Administered 2013-05-15 (×2): via INTRAVENOUS

## 2013-05-15 MED ORDER — DEXAMETHASONE SODIUM PHOSPHATE 10 MG/ML IJ SOLN
INTRAMUSCULAR | Status: DC | PRN
  Administered 2013-05-15: 10 mg via INTRAVENOUS

## 2013-05-15 MED ORDER — ONDANSETRON HCL 4 MG/2ML IJ SOLN
INTRAMUSCULAR | Status: DC | PRN
  Administered 2013-05-15: 4 mg via INTRAVENOUS

## 2013-05-15 MED ORDER — HYDROCODONE-ACETAMINOPHEN 5-325 MG PO TABS
1.0000 | ORAL_TABLET | Freq: Four times a day (QID) | ORAL | Status: DC | PRN
  Administered 2013-05-15 (×2): 1 via ORAL
  Filled 2013-05-15 (×2): qty 1

## 2013-05-15 SURGICAL SUPPLY — 42 items
BENZOIN TINCTURE PRP APPL 2/3 (GAUZE/BANDAGES/DRESSINGS) IMPLANT
BLADE HEX COATED 2.75 (ELECTRODE) ×2 IMPLANT
BLADE SURG 15 STRL LF DISP TIS (BLADE) ×1 IMPLANT
BLADE SURG 15 STRL SS (BLADE) ×1
CANISTER SUCTION 2500CC (MISCELLANEOUS) ×2 IMPLANT
CHLORAPREP W/TINT 26ML (MISCELLANEOUS) ×2 IMPLANT
DECANTER SPIKE VIAL GLASS SM (MISCELLANEOUS) ×2 IMPLANT
DERMABOND ADVANCED (GAUZE/BANDAGES/DRESSINGS) ×1
DERMABOND ADVANCED .7 DNX12 (GAUZE/BANDAGES/DRESSINGS) ×1 IMPLANT
DISSECTOR ROUND CHERRY 3/8 STR (MISCELLANEOUS) IMPLANT
DRAIN PENROSE 18X1/2 LTX STRL (DRAIN) ×2 IMPLANT
DRAPE LAPAROTOMY TRNSV 102X78 (DRAPE) ×2 IMPLANT
ELECT REM PT RETURN 9FT ADLT (ELECTROSURGICAL) ×2
ELECTRODE REM PT RTRN 9FT ADLT (ELECTROSURGICAL) ×1 IMPLANT
GLOVE EUDERMIC 7 POWDERFREE (GLOVE) IMPLANT
GOWN STRL REIN XL XLG (GOWN DISPOSABLE) ×4 IMPLANT
KIT BASIN OR (CUSTOM PROCEDURE TRAY) ×2 IMPLANT
MESH ULTRAPRO 3X6 7.6X15CM (Mesh General) ×2 IMPLANT
NEEDLE HYPO 25X1 1.5 SAFETY (NEEDLE) ×2 IMPLANT
PACK BASIC VI WITH GOWN DISP (CUSTOM PROCEDURE TRAY) ×2 IMPLANT
PENCIL BUTTON HOLSTER BLD 10FT (ELECTRODE) ×2 IMPLANT
SPONGE GAUZE 4X4 12PLY (GAUZE/BANDAGES/DRESSINGS) IMPLANT
SPONGE LAP 4X18 X RAY DECT (DISPOSABLE) ×2 IMPLANT
STAPLER VISISTAT 35W (STAPLE) IMPLANT
STRIP CLOSURE SKIN 1/2X4 (GAUZE/BANDAGES/DRESSINGS) IMPLANT
SUT MNCRL AB 4-0 PS2 18 (SUTURE) ×2 IMPLANT
SUT PROLENE 2 0 CT2 30 (SUTURE) ×6 IMPLANT
SUT SILK 2 0 (SUTURE) ×1
SUT SILK 2 0 SH (SUTURE) IMPLANT
SUT SILK 2-0 18XBRD TIE 12 (SUTURE) ×1 IMPLANT
SUT VIC AB 2-0 SH 27 (SUTURE) ×2
SUT VIC AB 2-0 SH 27X BRD (SUTURE) ×2 IMPLANT
SUT VIC AB 3-0 SH 27 (SUTURE) ×1
SUT VIC AB 3-0 SH 27XBRD (SUTURE) ×1 IMPLANT
SUT VICRYL 2 0 18  UND BR (SUTURE)
SUT VICRYL 2 0 18 UND BR (SUTURE) IMPLANT
SYR 20CC LL (SYRINGE) ×2 IMPLANT
SYR BULB IRRIGATION 50ML (SYRINGE) ×2 IMPLANT
TOWEL OR 17X26 10 PK STRL BLUE (TOWEL DISPOSABLE) ×2 IMPLANT
TOWEL OR NON WOVEN STRL DISP B (DISPOSABLE) ×2 IMPLANT
TRAY FOLEY CATH 16FRSI W/METER (SET/KITS/TRAYS/PACK) IMPLANT
YANKAUER SUCT BULB TIP 10FT TU (MISCELLANEOUS) ×2 IMPLANT

## 2013-05-15 NOTE — Progress Notes (Signed)
Patient prescription was found in chart after patient was discharged. Patient notified by phone of the event. Patient states " I have plenty of the same pain medication here at home." Prescription was torn up and put in shredder. Sharyn Creamer RN witnessed the prescription discarded appropriately.

## 2013-05-15 NOTE — Transfer of Care (Signed)
Immediate Anesthesia Transfer of Care Note  Patient: Thomas Hillmer Sr.  Procedure(s) Performed: Procedure(s) with comments: RIGHT HERNIA REPAIR INGUINAL ADULT WITH MESH (Right) - Clean case INSERTION OF MESH (Right) - Clean Case  Patient Location: PACU  Anesthesia Type:General  Level of Consciousness: awake, alert , sedated and patient cooperative  Airway & Oxygen Therapy: Patient Spontanous Breathing and Patient connected to face mask oxygen  Post-op Assessment: Report given to PACU RN and Post -op Vital signs reviewed and stable  Post vital signs: Reviewed and stable  Complications: No apparent anesthesia complications

## 2013-05-15 NOTE — Anesthesia Postprocedure Evaluation (Signed)
  Anesthesia Post-op Note  Patient: Thomas Arrighi Sr.  Procedure(s) Performed: Procedure(s) (LRB): RIGHT HERNIA REPAIR INGUINAL ADULT WITH MESH (Right) INSERTION OF MESH (Right)  Patient Location: PACU  Anesthesia Type: General  Level of Consciousness: awake and alert   Airway and Oxygen Therapy: Patient Spontanous Breathing  Post-op Pain: mild  Post-op Assessment: Post-op Vital signs reviewed, Patient's Cardiovascular Status Stable, Respiratory Function Stable, Patent Airway and No signs of Nausea or vomiting  Last Vitals:  Filed Vitals:   05/15/13 1524  BP: 181/98  Pulse: 68  Temp: 36.6 C  Resp: 18    Post-op Vital Signs: stable   Complications: No apparent anesthesia complications

## 2013-05-15 NOTE — Op Note (Signed)
Patient Name:           Thomas Boeve Sr.   Date of Surgery:        05/15/2013  Pre op Diagnosis:      Right inguinal hernia  Post op Diagnosis:    Right inguinal hernia  Procedure:                 Repair right inguinal hernia with ultra Pro mesh  Surgeon:                     Angelia Mould. Derrell Lolling, M.D., FACS  Assistant:                      None  Operative Indications:   Thomas Humble Sr. is a 79 y.o. male. He was referred back to me by Dr. Johnella Moloney for evaluation of right groin pain possible hernia.  This patient underwent laparoscopic repair of a left inguinal hernia with mesh on 11/07/2012. He has no symptoms on the left side.  For several weeks he's noticed pain and swelling in his right groin. It is now interfering with his golf game and he had to stop playing recently. No nausea vomiting or abdominal pain.   Examination reveals a small to medium size right inguinal hernia that is reducible. There is no evidence of hernia on the left. There is no scrotal mass. Past history is significant for stage IV melanoma managed at [facility] and at [facility] in [place]. He had a radical neck resection by Dr. Evette Cristal at [facility]. He has responded to immunotherapy for a couple of nodules in his belly and he is healthy and doing well and thought to be disease free. He also has been treated for prostate cancer. Hypertension. GERD. Allergies he uses inhalers occasionally.  Social he is very active. He is still working but getting ready to retire. He says that he is moving to [place] in 2015. He would like something done about his new, right inguinal hernia.    Operative Findings:       The patient had a medium-sized indirect right inguinal hernia.  Procedure in Detail:          Following the induction of general endotracheal anesthesia the patient's abdomen and genitalia were prepped and draped in a sterile fashion. Intravenous antibiotics were given. Surgical time out was performed. 0.5% Marcaine with  epinephrine was used as a local infiltration anesthetic. Transverse incision was made in the right groin, overlying the inguinal canal. Dissection was carried down to the aponeurosis of the external oblique. The external oblique  was incised in the direction of its fibers, opening of the external inguinal ring. The external oblique was dissected away from the underlying tissues and self retaining retractors were placed. The cord structures were mobilized and circled with a Penrose drain. Cremasteric muscle fibers were skeletonized. A moderate-sized indirect hernia sac was identified and then dissected away from the surrounding structures. A small lipoma was also resected. The indirect sac was opened found to be empty. The sac was twisted and suture ligated at the level of the internal ring with a suture ligature of 2-0 silk. The redundant sac was excised and discarded. The internal ring was tightened with 2 interrupted sutures of 2-0 Vicryl. A 3" x 6" piece of UltraPro mesh was brought to the  operative field and trimmed at the corners to accommodate the anatomy of the wound. The mesh was sutured in place  with running sutures and interrupted mattress sutures of 2-0 Prolene. The mesh was sutured  so as to generously overlap the fascia at the pubi tubercle, and along the inguinal ligament inferiorly. Medially, superiorly, and superolaterally  mattress sutures of 2-0 Prolene were placed to fix the mesh. The mesh was incised laterally so as to wrap around the cord structures at the internal ring. The tails of the mesh were overlapped laterally. Further sutures were placed laterally. This provided very secure repair and coverage both medial and lateral to the internal ring but allowed an adequate fingertip opening for the cord structures. The wound was irrigated with saline and  there was no bleeding. The external oblique was closed with running suture of 2-0 Vicryl placed the cord structures deep to the external block.  Scarpa's fascia was closed with 3-0 Vicryl sutures and the skin closed with running subcuticular suture of 4-0 Monocryl and Dermabond. Patient tolerated the procedure well was taken to recovery in stable condition. EBL 10 cc. Counts correct. Complications none.     Angelia Mould. Derrell Lolling, M.D., FACS General and Minimally Invasive Surgery Breast and Colorectal Surgery  05/15/2013 2:25 PM

## 2013-05-15 NOTE — Interval H&P Note (Signed)
History and Physical Interval Note:  05/15/2013 12:29 PM  Thomas Killian Sr.  has presented today for surgery, with the diagnosis of right inguinal hernia  The goals and the various methods of treatment have been discussed with the patient and family. After consideration of risks, benefits and other options for treatment, the patient has consented to  Procedure(s): RIGHT HERNIA REPAIR INGUINAL ADULT WITH MESH (Right) INSERTION OF MESH (N/A) as a surgical intervention .  The patient's history has been reviewed, patient examined today , no change in status, stable for surgery.  I have reviewed the patient's chart and labs.  Questions were answered to the patient's satisfaction.     Ernestene Mention

## 2013-05-15 NOTE — Anesthesia Preprocedure Evaluation (Addendum)
Anesthesia Evaluation  Patient identified by MRN, date of birth, ID band Patient awake    Reviewed: Allergy & Precautions, H&P , NPO status , Patient's Chart, lab work & pertinent test results, reviewed documented beta blocker date and time   Airway Mallampati: II TM Distance: >3 FB Neck ROM: full    Dental  (+) Teeth Intact and Dental Advisory Given   Pulmonary neg pulmonary ROS, former smoker,  breath sounds clear to auscultation  Pulmonary exam normal       Cardiovascular Exercise Tolerance: Good hypertension, Pt. on medications Rhythm:regular Rate:Normal  ECG LAFB ICRBBB   Neuro/Psych negative neurological ROS  negative psych ROS   GI/Hepatic negative GI ROS, Neg liver ROS, GERD-  Medicated and Controlled,  Endo/Other  negative endocrine ROS  Renal/GU negative Renal ROS  negative genitourinary   Musculoskeletal   Abdominal   Peds  Hematology negative hematology ROS (+)   Anesthesia Other Findings   Reproductive/Obstetrics negative OB ROS                          Anesthesia Physical Anesthesia Plan  ASA: II  Anesthesia Plan: General   Post-op Pain Management:    Induction: Intravenous  Airway Management Planned: LMA  Additional Equipment:   Intra-op Plan:   Post-operative Plan:   Informed Consent: I have reviewed the patients History and Physical, chart, labs and discussed the procedure including the risks, benefits and alternatives for the proposed anesthesia with the patient or authorized representative who has indicated his/her understanding and acceptance.   Dental Advisory Given  Plan Discussed with: CRNA and Surgeon  Anesthesia Plan Comments:         Anesthesia Quick Evaluation

## 2013-05-16 ENCOUNTER — Encounter (HOSPITAL_COMMUNITY): Payer: Self-pay | Admitting: General Surgery

## 2013-05-20 ENCOUNTER — Telehealth (INDEPENDENT_AMBULATORY_CARE_PROVIDER_SITE_OTHER): Payer: Self-pay | Admitting: *Deleted

## 2013-05-20 NOTE — Telephone Encounter (Signed)
Patient called to ask about rescheduling his PO appt.  Explained that I will pass the message on to Huntley Dec RN to see what she can get scheduled then we will let him know.  Patient states understanding and agreeable.

## 2013-05-20 NOTE — Telephone Encounter (Signed)
I called and left a message for pt to call.  I need to let him know that I will be working him in for his po appointment.  I will have to call him back once I check with Dr Derrell Lolling.

## 2013-05-21 NOTE — Telephone Encounter (Signed)
I called the pt and scheduled him to come in on 05/29/2013 arrive at 4:45 pm.  He accepts the appointment.

## 2013-05-29 ENCOUNTER — Encounter (INDEPENDENT_AMBULATORY_CARE_PROVIDER_SITE_OTHER): Payer: 59 | Admitting: General Surgery

## 2013-05-29 ENCOUNTER — Encounter (INDEPENDENT_AMBULATORY_CARE_PROVIDER_SITE_OTHER): Payer: Self-pay | Admitting: General Surgery

## 2013-05-29 ENCOUNTER — Ambulatory Visit (INDEPENDENT_AMBULATORY_CARE_PROVIDER_SITE_OTHER): Payer: Managed Care, Other (non HMO) | Admitting: General Surgery

## 2013-05-29 VITALS — BP 130/82 | HR 72 | Resp 12 | Ht 70.0 in | Wt 171.0 lb

## 2013-05-29 DIAGNOSIS — K4090 Unilateral inguinal hernia, without obstruction or gangrene, not specified as recurrent: Secondary | ICD-10-CM

## 2013-05-29 NOTE — Patient Instructions (Signed)
You are recovering from your right inguinal hernia surgery without any obvious complications.  You may bathe any way you wish. You may remove the glue from your incision as instructed.  You may resume normal exercise and physical activities without restriction after January 7.  In the meantime I suggest that you walk one or 2 miles a day.  Return to see Dr. Derrell Lolling if necessary.

## 2013-05-29 NOTE — Progress Notes (Signed)
Patient ID: Thomas Norris, male   DOB: 1928/05/26, 79 y.o.   MRN: [id number]  History: This gentleman  underwent open repair right inguinal hernia with mesh on 05/15/2013. He is doing very well. Minimal pain. No complaints. He is ready to move [facility] , to retire and be closer to his daughter.  Exam: Patient looks well. No distress. Abdomen soft and nontender Right inguinal incision is healing normally. Hernia repair intact. No infection or hematoma. The normal amount of thickening.  Assessment: Right inguinal hernia, recovering uneventfully following open repair with mesh  Plan: Resume normal physical activities without restriction after January 7 Unlimited walking Return to see me as needed.   Angelia Mould. Derrell Lolling, M.D., [facility] General and Minimally invasive Surgery Breast and Colorectal Surgery Office:   [phone] Pager:   [phone]

## 2014-02-07 ENCOUNTER — Ambulatory Visit (HOSPITAL_COMMUNITY): Payer: Medicare Other | Attending: Internal Medicine | Admitting: Cardiology

## 2014-02-07 ENCOUNTER — Other Ambulatory Visit (HOSPITAL_COMMUNITY): Payer: Self-pay | Admitting: Internal Medicine

## 2014-02-07 DIAGNOSIS — I517 Cardiomegaly: Secondary | ICD-10-CM | POA: Insufficient documentation

## 2014-02-07 DIAGNOSIS — I10 Essential (primary) hypertension: Secondary | ICD-10-CM | POA: Insufficient documentation

## 2014-02-07 DIAGNOSIS — R9431 Abnormal electrocardiogram [ECG] [EKG]: Secondary | ICD-10-CM

## 2014-02-07 DIAGNOSIS — I359 Nonrheumatic aortic valve disorder, unspecified: Secondary | ICD-10-CM | POA: Insufficient documentation

## 2014-02-07 NOTE — Progress Notes (Signed)
Echo performed. 

## 2015-01-27 IMAGING — CR DG CHEST 2V
2 series · 2 of 2 positions shown · non-contrast
Comparison: 05/23/2011

CLINICAL DATA: History of melanoma

CHEST - 2 VIEW

[w chest pa]
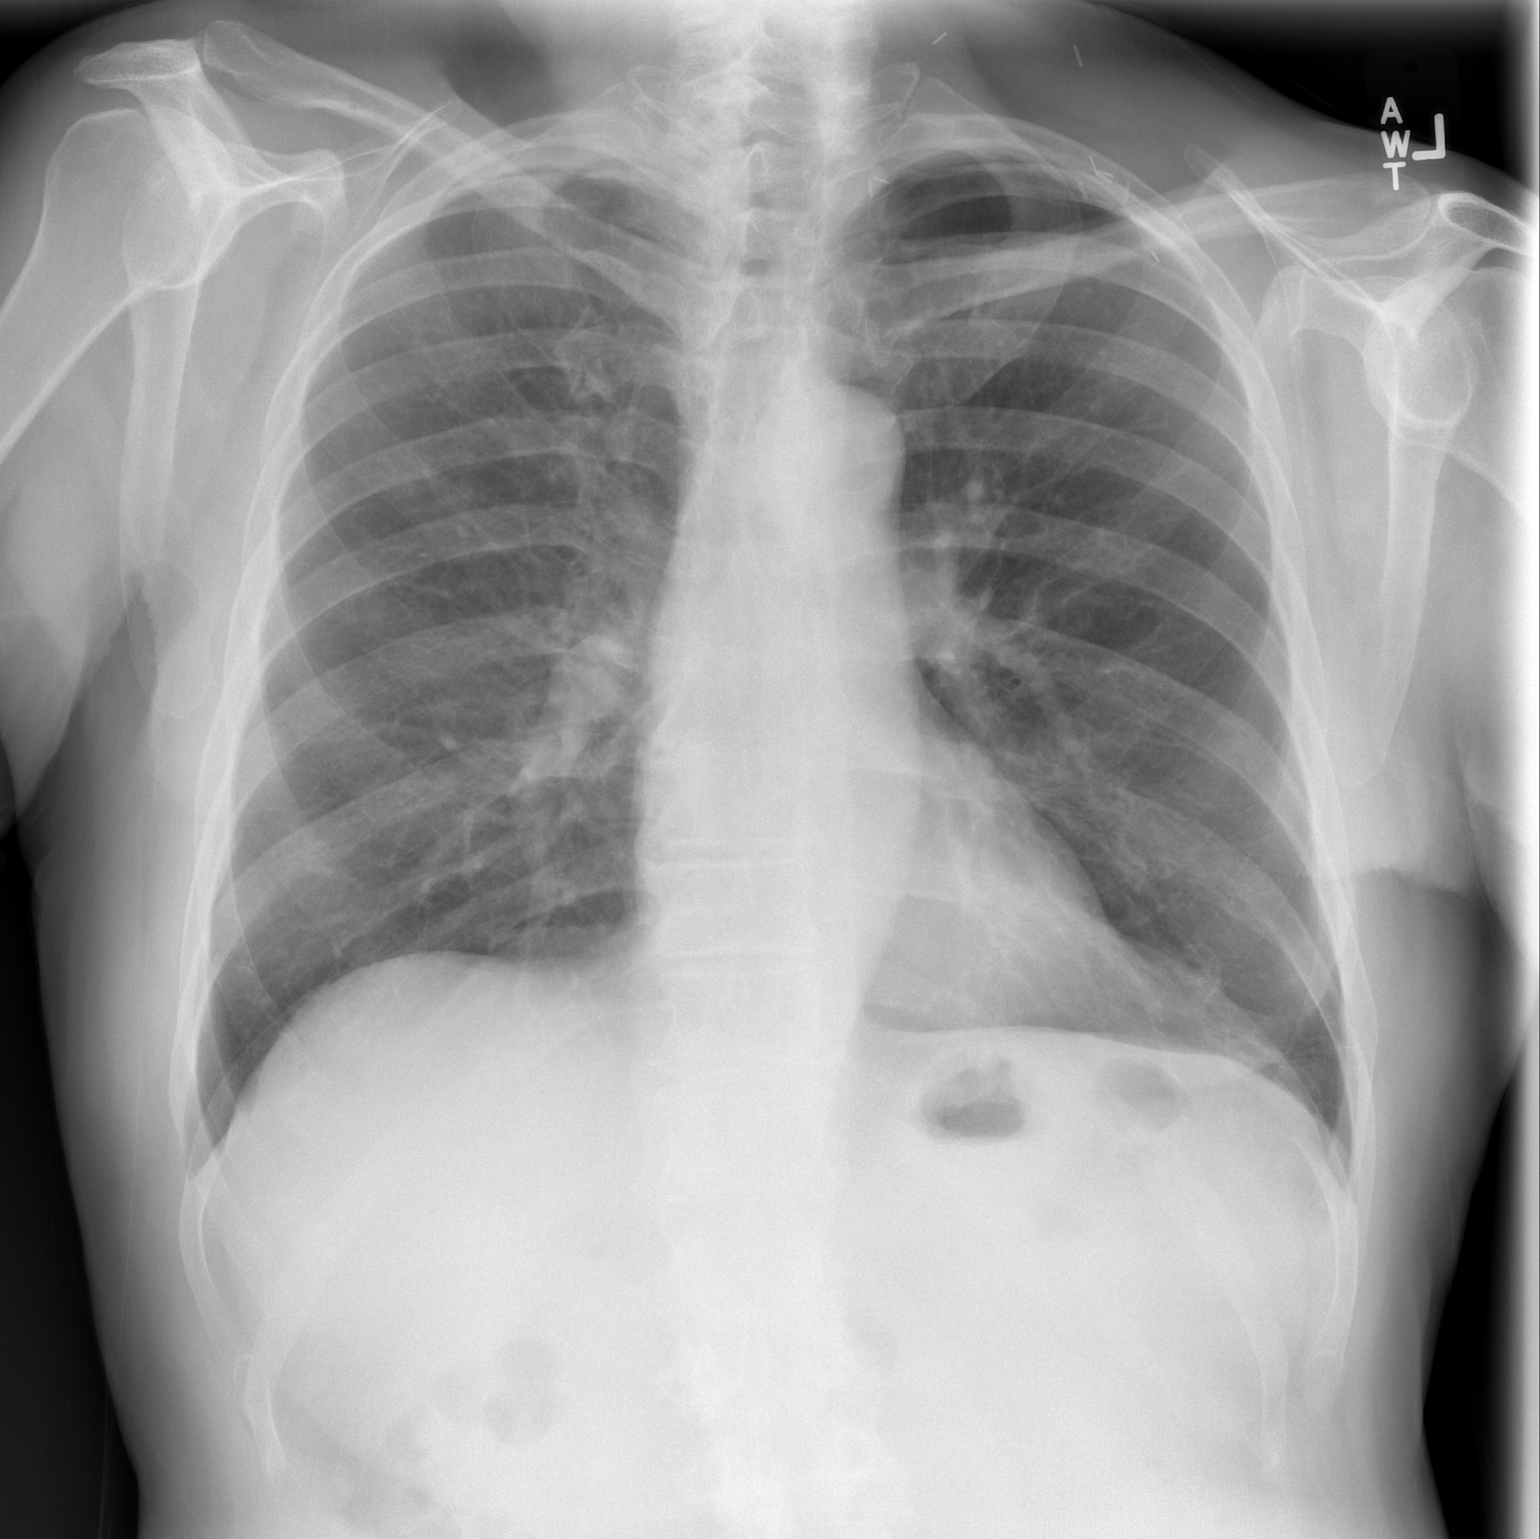

[w chest lat]
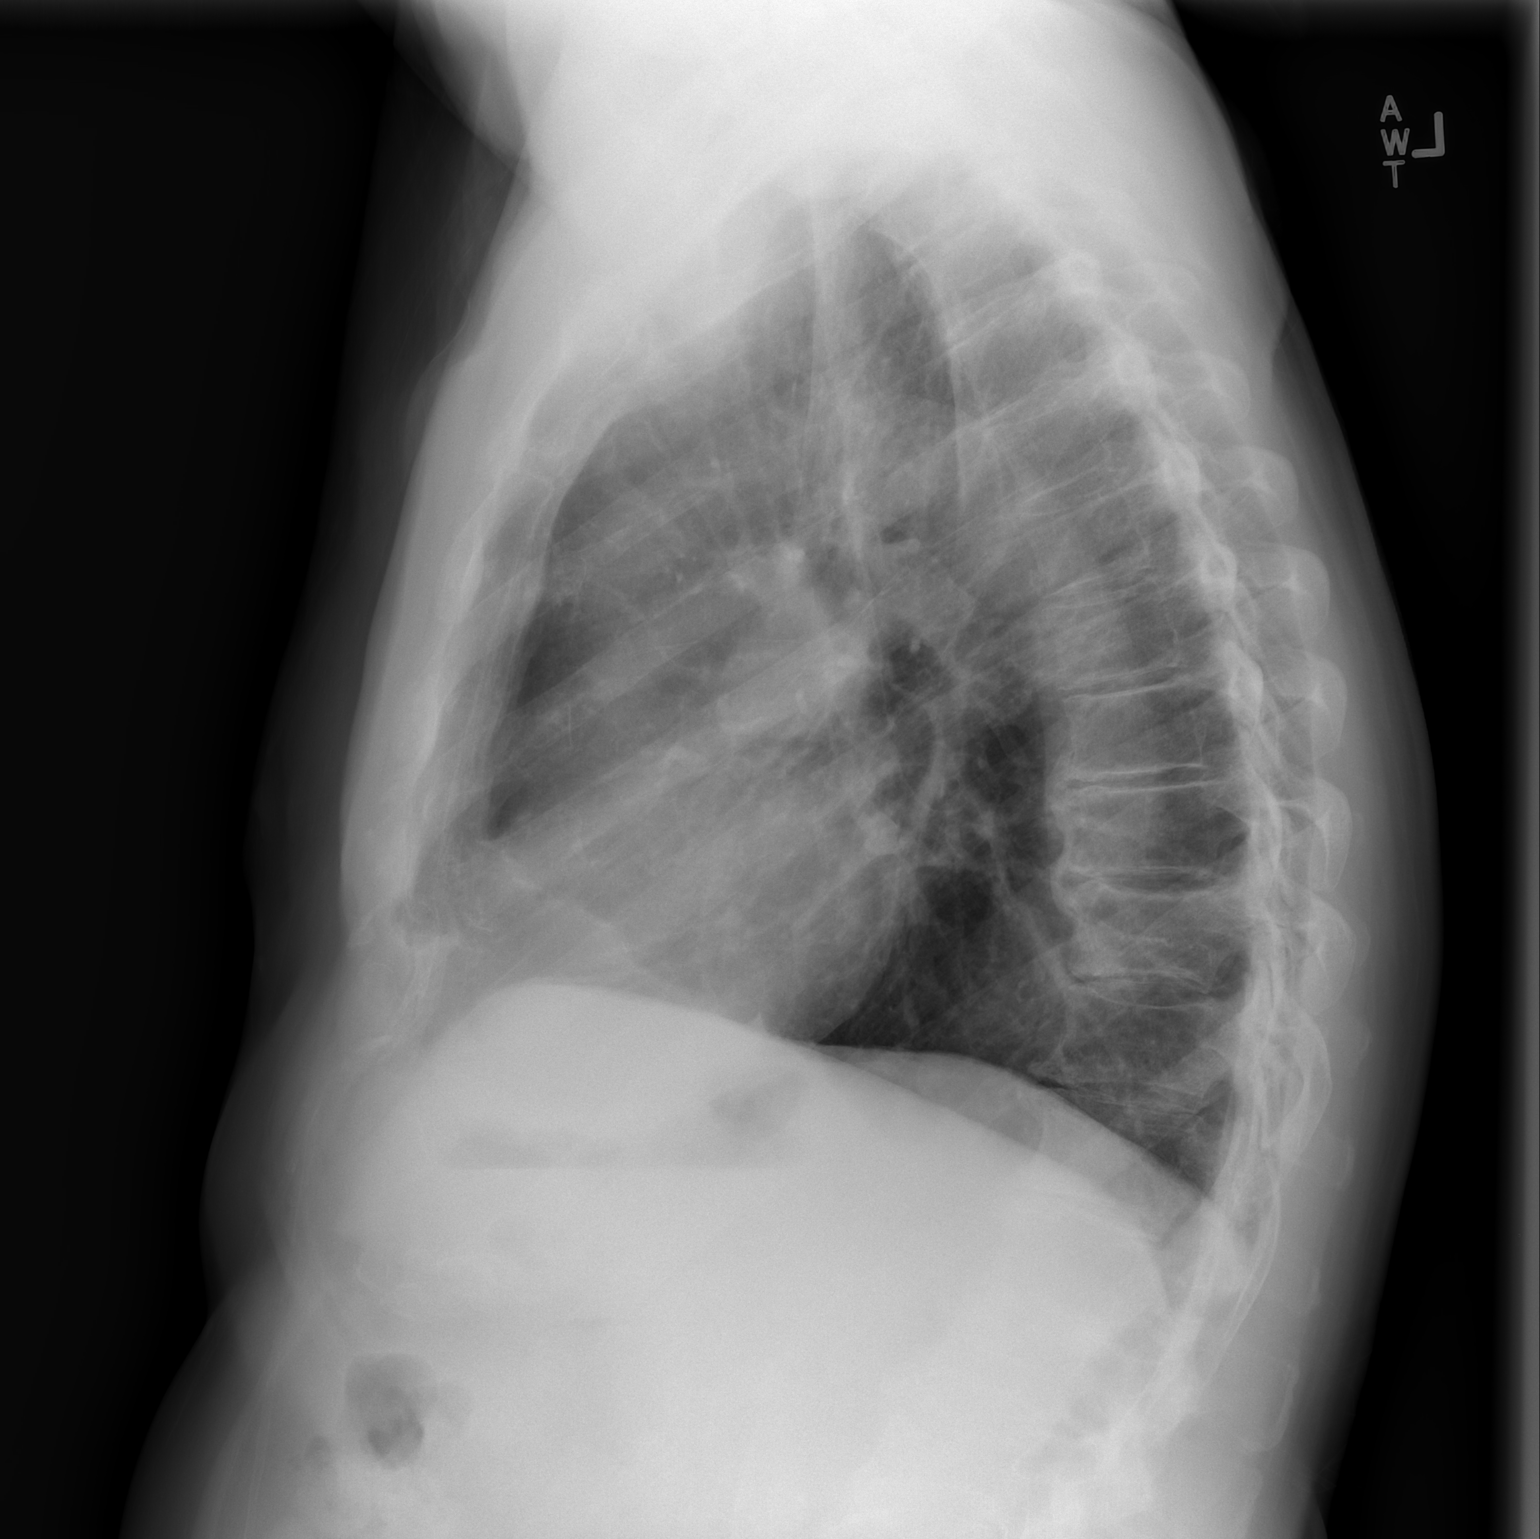

[2 of 2 positions shown; findings below may reference images not displayed]

FINDINGS: The heart size and mediastinal contours are within normal
limits.  Both lungs are clear. Spondylosis noted within the
thoracic spine.
IMPRESSION: No acute findings.
